# Patient Record
Sex: Male | Born: 1995
Health system: Southern US, Community
[De-identification: ages and names within clinical notes are randomized; demographics above are authoritative.]

## PROBLEM LIST (undated history)

## (undated) ENCOUNTER — Ambulatory Visit (HOSPITAL_COMMUNITY): Admission: EM | Payer: No Typology Code available for payment source | Source: Home / Self Care

## (undated) DIAGNOSIS — K5010 Crohn's disease of large intestine without complications: Secondary | ICD-10-CM

## (undated) DIAGNOSIS — K645 Perianal venous thrombosis: Secondary | ICD-10-CM

## (undated) DIAGNOSIS — F319 Bipolar disorder, unspecified: Secondary | ICD-10-CM

## (undated) HISTORY — DX: Perianal venous thrombosis: K64.5

## (undated) HISTORY — DX: Bipolar disorder, unspecified: F31.9

## (undated) HISTORY — PX: APPENDECTOMY: SHX54

---

## 2016-09-21 DIAGNOSIS — H5203 Hypermetropia, bilateral: Secondary | ICD-10-CM | POA: Insufficient documentation

## 2016-11-14 DIAGNOSIS — R591 Generalized enlarged lymph nodes: Secondary | ICD-10-CM | POA: Insufficient documentation

## 2017-07-31 DIAGNOSIS — F3181 Bipolar II disorder: Secondary | ICD-10-CM | POA: Insufficient documentation

## 2017-08-06 DIAGNOSIS — F1099 Alcohol use, unspecified with unspecified alcohol-induced disorder: Secondary | ICD-10-CM | POA: Insufficient documentation

## 2020-04-23 ENCOUNTER — Encounter (HOSPITAL_BASED_OUTPATIENT_CLINIC_OR_DEPARTMENT_OTHER): Payer: Self-pay | Admitting: Emergency Medicine

## 2020-04-23 ENCOUNTER — Other Ambulatory Visit: Payer: Self-pay

## 2020-04-23 ENCOUNTER — Emergency Department (HOSPITAL_BASED_OUTPATIENT_CLINIC_OR_DEPARTMENT_OTHER)
Admission: EM | Admit: 2020-04-23 | Discharge: 2020-04-23 | Disposition: A | Discharge status: Home / Self Care | Payer: Self-pay | Attending: Emergency Medicine | Admitting: Emergency Medicine

## 2020-04-23 DIAGNOSIS — K625 Hemorrhage of anus and rectum: Secondary | ICD-10-CM

## 2020-04-23 DIAGNOSIS — K649 Unspecified hemorrhoids: Secondary | ICD-10-CM

## 2020-04-23 DIAGNOSIS — K644 Residual hemorrhoidal skin tags: Secondary | ICD-10-CM | POA: Insufficient documentation

## 2020-04-23 HISTORY — DX: Bipolar disorder, unspecified: F31.9

## 2020-04-23 LAB — OCCULT BLOOD X 1 CARD TO LAB, STOOL: Fecal Occult Bld: NEGATIVE

## 2020-04-23 MED ORDER — HYDROCORTISONE (PERIANAL) 2.5 % EX CREA: 1.0000 "application " | TOPICAL_CREAM | Freq: Two times a day (BID) | CUTANEOUS | 0 refills | Status: DC

## 2020-04-23 NOTE — ED Triage Notes (Signed)
Reports rectal bleeding for a couple of weeks.  Thinks its a hemorrhoid.  Wanted to be seen at the [place] but they keep pushing his appointment back.

## 2020-04-23 NOTE — Discharge Instructions (Signed)
Use Anusol as directed.  You can also do sitz bath's to help with hemorrhoids.  I provided you with a referral to general surgery that you follow-up with regarding band ligation of her hemorrhoids.  I have also given a GI referral that you follow-up regarding her Crohn's disease.  Return the emergency department for any worsening pain, rectal bleeding, fevers or any other worsening concerning symptoms.

## 2020-04-23 NOTE — ED Provider Notes (Signed)
[facility] EMERGENCY DEPARTMENT Provider Note   CSN: [id number] Arrival date & time: 04/23/20  1741     History Chief Complaint  Patient presents with  . Rectal Bleeding    Kevin [name] is a 24 y.o. male past history of bipolar who presents for evaluation of hemorrhoid.  He reports that he started developing hemorrhoid 2 weeks ago.  He states that over the last 2 weeks, the area has become larger, more painful.  He states he has had some intermittent episodes of blood with bowel movements.  He states sometimes it is just a spotting.  He has not had any gross melena.  He states that he sees the [place] for his medical care and he has tried to see them to have this evaluated but they keep pushing back his appointment.  He states it is becoming more painful.  He states it hurts particular more when he tries to have a bowel movement feels like he has to strain.  He has a history of Crohn's and states that he does not take any medication for it.  He has not seen GI in about a year and a half.  He has not had any abdominal pain, fevers, nausea/vomiting.  He denies any recent anal sex or inserting any foreign body into the rectum.   The history is provided by the patient.       Past Medical History:  Diagnosis Date  . Bipolar 1 disorder (HCC)     There are no problems to display for this patient.   Past Surgical History:  Procedure Laterality Date  . APPENDECTOMY         No family history on file.  Social History   Tobacco Use  . Smoking status: Never Smoker  . Smokeless tobacco: Never Used  Substance Use Topics  . Alcohol use: Yes    Comment: off and on  . Drug use: Never    Home Medications Prior to Admission medications   Medication Sig Start Date End Date Taking? Authorizing Provider  hydrocortisone (ANUSOL-HC) 2.5 % rectal cream Place 1 application rectally 2 (two) times daily. 04/23/20   Maxwell Caul, PA-C    Allergies    Patient has no allergy  information on record.  Review of Systems   Review of Systems  Constitutional: Negative for fever.  Respiratory: Negative for shortness of breath.   Gastrointestinal: Positive for blood in stool. Negative for nausea and vomiting.  All other systems reviewed and are negative.   Physical Exam Updated Vital Signs BP 115/80 (BP Location: Right Arm)   Pulse 63   Temp 97.9 F (36.6 C) (Oral)   Resp 16   Ht 5\' 9"  (1.753 m)   Wt 58.5 kg   SpO2 100%   BMI 19.05 kg/m   Physical Exam Vitals and nursing note reviewed. Exam conducted with a chaperone present.  Constitutional:      Appearance: He is well-developed.  HENT:     Head: Normocephalic and atraumatic.  Eyes:     General: No scleral icterus.       Right eye: No discharge.        Left eye: No discharge.     Conjunctiva/sclera: Conjunctivae normal.  Pulmonary:     Effort: Pulmonary effort is normal.  Abdominal:     Comments: Abdomen is soft, non-distended, non-tender. No rigidity, No guarding. No peritoneal signs.  Genitourinary:    Comments: The exam was performed with a chaperone present , Jorja Loa).  Small nonthrombosed external hemorrhoid measuring about 1.5 cm noted to the 10:00 region.  No external bleeding.  No surrounding warmth, erythema.  He did have some pain with digital rectal exam.  No mass noted.  No occult blood noted on my exam. Skin:    General: Skin is warm and dry.  Neurological:     Mental Status: He is alert.  Psychiatric:        Speech: Speech normal.        Behavior: Behavior normal.     ED Results / Procedures / Treatments   Labs (all labs ordered are listed, but only abnormal results are displayed) Labs Reviewed  OCCULT BLOOD X 1 CARD TO LAB, STOOL    EKG None  Radiology No results found.  Procedures Procedures (including critical care time)  Medications Ordered in ED Medications - No data to display  ED Course  I have reviewed the triage vital signs and the nursing  notes.  Pertinent labs & imaging results that were available during my care of the patient were reviewed by me and considered in my medical decision making (see chart for details).    MDM Rules/Calculators/A&P                          24 year old male past medical history of Crohn's who presents for evaluation of rectal bleeding and hemorrhoids that have been ongoing for 2 weeks.  History of Crohn's.  Does not currently take any medicine or follow with GI.  Has not had any abdominal pain, fevers.  Denies any anal sex or rectal foreign body.  On initial arrival, he is afebrile nontoxic-appearing.  Vital signs are stable.  Rectal exam done with chaperone present shows a small 1.5 cm nonthrombosed external hemorrhoid at the 10:00 region.  No active bleeding.  Digital rectal exam showed no evidence of occult blood.  He did have some mild pain noted with the rectal exam at the hemorrhoid area.  No mass noted.  Fecal occult is negative.  Discussed results with patient.  I discussed with patient that at this time given the small size of the hemorrhoid and as well as its proximity to the anal sphincter, do not feel that it is amenable to I&D here in the ED.  His exam is not concerning for perirectal or perianal abscess.  I did discuss with him regarding the 2 weeks of rectal bleeding and given his history of Crohn's, we talked about potentially doing further work-up with lab work and imaging to evaluate for an acute Crohn's flare vs other cause of rectal bleeding.  At this time, do not suspect that he has acute GI but question if this is coming from internal hemorrhoids versus some other process.  We discussed at length regarding further work-up here in the emergency department after engaging in shared decision-making, patient opted to decline any further work-up here in the ED.  He states that he does not have any abdominal pain and he does not feel like this is a Crohn's flare.  He states that he has been  having normal bowel movements and states that he just wants the hemorrhoid taking care of.  We talked about referring him to outpatient general surgery for possible band ligation.  Additionally, he would like to have a GI referral that he can follow-up regarding his Crohn's care.  I discussed with patient that if anytime, symptoms get worse, he can return to the emergency department.  At this time, patient exhibits no emergent life-threatening condition that require further evaluation in ED. Patient had ample opportunity for questions and discussion. All patient's questions were answered with full understanding. Strict return precautions discussed. Patient expresses understanding and agreement to plan.   Portions of this note were generated with Scientist, clinical (histocompatibility and immunogenetics). Dictation errors may occur despite best attempts at proofreading.   Final Clinical Impression(s) / ED Diagnoses Final diagnoses:  Hemorrhoids, unspecified hemorrhoid type  Rectal bleeding    Rx / DC Orders ED Discharge Orders         Ordered    hydrocortisone (ANUSOL-HC) 2.5 % rectal cream  2 times daily        04/23/20 1905           Rosana Hoes 04/23/20 2004    Rolan Bucco, MD 04/23/20 2015

## 2020-05-03 ENCOUNTER — Telehealth: Payer: Self-pay

## 2020-05-03 NOTE — Telephone Encounter (Signed)
Spoke to patient who nees to be seen in hospital follow up for hemorrhoids. He states that he needs to wait on a referral from the [place] before scheduling an appointment. He said this can take up to a year. I have mailed him a letter,and he will call the office to schedule once he receives the referral.

## 2020-06-05 ENCOUNTER — Other Ambulatory Visit: Payer: Self-pay

## 2020-06-05 ENCOUNTER — Emergency Department (HOSPITAL_COMMUNITY)
Admission: EM | Admit: 2020-06-05 | Discharge: 2020-06-05 | Disposition: A | Discharge status: Home / Self Care | Payer: Self-pay | Attending: Emergency Medicine | Admitting: Emergency Medicine

## 2020-06-05 DIAGNOSIS — Z5321 Procedure and treatment not carried out due to patient leaving prior to being seen by health care provider: Secondary | ICD-10-CM | POA: Insufficient documentation

## 2020-06-05 DIAGNOSIS — T50901A Poisoning by unspecified drugs, medicaments and biological substances, accidental (unintentional), initial encounter: Secondary | ICD-10-CM | POA: Insufficient documentation

## 2020-06-05 NOTE — ED Notes (Addendum)
Pt decided to leave during triage d/t wait

## 2020-06-05 NOTE — ED Triage Notes (Signed)
Pt presents to ED POV. Pt c/o taking 600mg  Lamictal. Pt is not prescribed medication. Pt reports he was taking it to help sleep, pt denies SI. Pt called poison control and asked pt to come to ED.

## 2020-10-05 ENCOUNTER — Other Ambulatory Visit: Payer: Self-pay

## 2020-10-05 ENCOUNTER — Encounter (HOSPITAL_BASED_OUTPATIENT_CLINIC_OR_DEPARTMENT_OTHER): Payer: Self-pay

## 2020-10-05 ENCOUNTER — Emergency Department (HOSPITAL_BASED_OUTPATIENT_CLINIC_OR_DEPARTMENT_OTHER): Payer: Non-veteran care

## 2020-10-05 ENCOUNTER — Emergency Department (HOSPITAL_BASED_OUTPATIENT_CLINIC_OR_DEPARTMENT_OTHER): Payer: Non-veteran care | Admitting: Radiology

## 2020-10-05 ENCOUNTER — Emergency Department (HOSPITAL_BASED_OUTPATIENT_CLINIC_OR_DEPARTMENT_OTHER)
Admission: EM | Admit: 2020-10-05 | Discharge: 2020-10-05 | Disposition: A | Discharge status: Home / Self Care | Payer: Non-veteran care | Attending: Emergency Medicine | Admitting: Emergency Medicine

## 2020-10-05 DIAGNOSIS — R1011 Right upper quadrant pain: Secondary | ICD-10-CM | POA: Diagnosis not present

## 2020-10-05 DIAGNOSIS — R0782 Intercostal pain: Secondary | ICD-10-CM | POA: Insufficient documentation

## 2020-10-05 DIAGNOSIS — R110 Nausea: Secondary | ICD-10-CM | POA: Insufficient documentation

## 2020-10-05 DIAGNOSIS — B359 Dermatophytosis, unspecified: Secondary | ICD-10-CM | POA: Diagnosis not present

## 2020-10-05 LAB — COMPREHENSIVE METABOLIC PANEL
ALT: 10 U/L (ref 0–44)
AST: 15 U/L (ref 15–41)
Albumin: 4.8 g/dL (ref 3.5–5.0)
Alkaline Phosphatase: 34 U/L — ABNORMAL LOW (ref 38–126)
Anion gap: 8 (ref 5–15)
BUN: 23 mg/dL — ABNORMAL HIGH (ref 6–20)
CO2: 26 mmol/L (ref 22–32)
Calcium: 9.8 mg/dL (ref 8.9–10.3)
Chloride: 106 mmol/L (ref 98–111)
Creatinine, Ser: 0.97 mg/dL (ref 0.61–1.24)
GFR, Estimated: >60 mL/min (ref >60)
Glucose, Bld: 92 mg/dL (ref 70–99)
Potassium: 4.3 mmol/L (ref 3.5–5.1)
Sodium: 140 mmol/L (ref 135–145)
Total Bilirubin: 0.6 mg/dL (ref 0.3–1.2)
Total Protein: 7 g/dL (ref 6.5–8.1)

## 2020-10-05 LAB — CBC WITH DIFFERENTIAL/PLATELET
Abs Immature Granulocytes: 0 10*3/uL (ref 0.00–0.07)
Basophils Absolute: 0 10*3/uL (ref 0.0–0.1)
Basophils Relative: 1 %
Eosinophils Absolute: 0 10*3/uL (ref 0.0–0.5)
Eosinophils Relative: 1 %
HCT: 42.6 % (ref 39.0–52.0)
Hemoglobin: 14.1 g/dL (ref 13.0–17.0)
Immature Granulocytes: 0 %
Lymphocytes Relative: 33 %
Lymphs Abs: 1.2 10*3/uL (ref 0.7–4.0)
MCH: 28.3 pg (ref 26.0–34.0)
MCHC: 33.1 g/dL (ref 30.0–36.0)
MCV: 85.4 fL (ref 80.0–100.0)
Monocytes Absolute: 0.3 10*3/uL (ref 0.1–1.0)
Monocytes Relative: 7 %
Neutro Abs: 2.1 10*3/uL (ref 1.7–7.7)
Neutrophils Relative %: 58 %
Platelets: 227 10*3/uL (ref 150–400)
RBC: 4.99 MIL/uL (ref 4.22–5.81)
RDW: 12 % (ref 11.5–15.5)
WBC: 3.6 10*3/uL — ABNORMAL LOW (ref 4.0–10.5)
nRBC: 0 % (ref 0.0–0.2)

## 2020-10-05 LAB — LIPASE, BLOOD: Lipase: 43 U/L (ref 11–51)

## 2020-10-05 MED ORDER — KETOROLAC TROMETHAMINE 30 MG/ML IJ SOLN
30.0000 mg | Freq: Once | INTRAMUSCULAR | Status: AC
  Administered 2020-10-05: 30 mg via INTRAVENOUS
  Filled 2020-10-05: qty 1

## 2020-10-05 MED ORDER — KETOCONAZOLE 2 % EX CREA
1.0000 | TOPICAL_CREAM | Freq: Every day | CUTANEOUS | 0 refills | Status: DC
Start: 2020-10-05 — End: 2021-02-09

## 2020-10-05 NOTE — ED Provider Notes (Addendum)
[facility] EMERGENCY DEPT Provider Note   CSN: [id number] Arrival date & time: 10/05/20  2012     History Chief Complaint  Patient presents with  . Abdominal Pain    Kevin [name] is a 25 y.o. male.  HPI   25 year old male presents to the emergency department with upper abdominal pain.  Patient states that he has been having this intermittently for the past 2 months however tonight it woke him up from sleep and was sharp associated with nausea.  Patient denies any fever.  No history of issues with his gallbladder or pancreas before.  No history of GERD.  He has had a slight decrease in appetite but denies any vomiting, diarrhea.  No genitourinary symptoms.  Patient also has concern for a rash on his right shoulder.  Past Medical History:  Diagnosis Date  . Bipolar 1 disorder (HCC)     There are no problems to display for this patient.   Past Surgical History:  Procedure Laterality Date  . APPENDECTOMY         No family history on file.  Social History   Tobacco Use  . Smoking status: Never Smoker  . Smokeless tobacco: Never Used  Substance Use Topics  . Alcohol use: Not Currently  . Drug use: Never    Home Medications Prior to Admission medications   Medication Sig Start Date End Date Taking? Authorizing Provider  hydrocortisone (ANUSOL-HC) 2.5 % rectal cream Place 1 application rectally 2 (two) times daily. 04/23/20   Maxwell Caul, PA-C    Allergies    Patient has no allergy information on record.  Review of Systems   Review of Systems  Constitutional: Positive for appetite change. Negative for chills and fever.  HENT: Negative for congestion.   Eyes: Negative for visual disturbance.  Respiratory: Negative for shortness of breath.   Cardiovascular: Negative for chest pain.  Gastrointestinal: Positive for abdominal pain and nausea. Negative for diarrhea and vomiting.  Genitourinary: Negative for dysuria.  Skin: Negative for rash.   Neurological: Negative for headaches.    Physical Exam Updated Vital Signs BP 113/71 (BP Location: Left Arm)   Pulse (!) 55   Temp 98.5 F (36.9 C) (Oral)   Resp 18   Ht 5\' 9"  (1.753 m)   Wt 59.9 kg   SpO2 100%   BMI 19.49 kg/m   Physical Exam Vitals and nursing note reviewed.  Constitutional:      Appearance: Normal appearance.  HENT:     Head: Normocephalic.     Mouth/Throat:     Mouth: Mucous membranes are moist.  Cardiovascular:     Rate and Rhythm: Normal rate.  Pulmonary:     Effort: Pulmonary effort is normal. No respiratory distress.  Abdominal:     Palpations: Abdomen is soft.     Tenderness: There is abdominal tenderness in the right upper quadrant, epigastric area and left upper quadrant. There is no guarding or rebound.  Skin:    General: Skin is warm.     Comments: Discolored rash on the right shoulder, circular with central clearing, suspicious for tinea  Neurological:     Mental Status: He is alert and oriented to person, place, and time. Mental status is at baseline.  Psychiatric:        Mood and Affect: Mood normal.     ED Results / Procedures / Treatments   Labs (all labs ordered are listed, but only abnormal results are displayed) Labs Reviewed  CBC WITH  DIFFERENTIAL/PLATELET - Abnormal; Notable for the following components:      Result Value   WBC 3.6 (*)    All other components within normal limits  LIPASE, BLOOD  COMPREHENSIVE METABOLIC PANEL    EKG None  Radiology DG Ribs Unilateral W/Chest Left  Result Date: 10/05/2020 CLINICAL DATA:  Recent fall with left rib pain, initial encounter EXAM: LEFT RIBS AND CHEST - 3+ VIEW COMPARISON:  None. FINDINGS: No fracture or other bone lesions are seen involving the ribs. There is no evidence of pneumothorax or pleural effusion. Both lungs are clear. Heart size and mediastinal contours are within normal limits. IMPRESSION: No acute abnormality noted. Electronically Signed   By: Alcide Clever M.D.    On: 10/05/2020 22:37    Procedures Procedures   Medications Ordered in ED Medications  ketorolac (TORADOL) 30 MG/ML injection 30 mg (30 mg Intravenous Given 10/05/20 2216)    ED Course  I have reviewed the triage vital signs and the nursing notes.  Pertinent labs & imaging results that were available during my care of the patient were reviewed by me and considered in my medical decision making (see chart for details).    MDM Rules/Calculators/A&P                          25 year old male presents the emergency department with upper abdominal pain, nausea and a rash on his right shoulder.  Vitals are stable on arrival, he is afebrile.  He has inconsistent tenderness to palpation of the upper abdomen but is otherwise a benign exam.  He does have what appears to be tinea on the right shoulder.  Will evaluate blood work.  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  We will do ultrasound to rule out gallbladder pathology and treat symptomatically.   Ultrasound is unremarkable, patient states that his symptoms are improved.  Most likely musculoskeletal.  Patient will be discharged and treated as an outpatient.  Discharge plan and strict return to ED precautions discussed, patient verbalizes understanding and agreement.  Final Clinical Impression(s) / ED Diagnoses Final diagnoses:  RUQ abdominal pain    Rx / DC Orders ED Discharge Orders    None       Rozelle Logan, DO 10/05/20 2309    Rozelle Logan, DO 10/05/20 2348

## 2020-10-05 NOTE — Discharge Instructions (Addendum)
You have been seen and discharged from the emergency department.  Your blood work, chest x-ray and ultrasound were negative.  Your most likely suffering from musculoskeletal pain.  You may treated as such.  Use cream for rash as directed.  Follow-up with your primary provider for reevaluation and further care. Take home medications as prescribed. If you have any worsening symptoms or further concerns for your health please return to an emergency department for further evaluation.

## 2020-10-05 NOTE — ED Triage Notes (Signed)
Patient here POV from Home with ABD Pain.  Pain is Left Upper Quandrant and radiates to R side when palpated.   Pain began 2 months ago and has been worsening. Pain came today because it awoke him from sleep last night.   Nausea. No Vomiting/Diarrhea. No SOB or CP.

## 2020-12-28 ENCOUNTER — Encounter: Payer: Self-pay | Admitting: Internal Medicine

## 2020-12-28 ENCOUNTER — Encounter (HOSPITAL_BASED_OUTPATIENT_CLINIC_OR_DEPARTMENT_OTHER): Payer: Self-pay | Admitting: Emergency Medicine

## 2020-12-28 ENCOUNTER — Emergency Department (HOSPITAL_BASED_OUTPATIENT_CLINIC_OR_DEPARTMENT_OTHER)
Admission: EM | Admit: 2020-12-28 | Discharge: 2020-12-28 | Disposition: A | Discharge status: Home / Self Care | Payer: Self-pay | Attending: Emergency Medicine | Admitting: Emergency Medicine

## 2020-12-28 ENCOUNTER — Other Ambulatory Visit: Payer: Self-pay

## 2020-12-28 DIAGNOSIS — R197 Diarrhea, unspecified: Secondary | ICD-10-CM | POA: Insufficient documentation

## 2020-12-28 DIAGNOSIS — K648 Other hemorrhoids: Secondary | ICD-10-CM | POA: Insufficient documentation

## 2020-12-28 DIAGNOSIS — K644 Residual hemorrhoidal skin tags: Secondary | ICD-10-CM

## 2020-12-28 MED ORDER — HYDROCORTISONE (PERIANAL) 2.5 % EX CREA: 1.0000 "application " | TOPICAL_CREAM | Freq: Two times a day (BID) | CUTANEOUS | 0 refills | Status: DC

## 2020-12-28 NOTE — ED Provider Notes (Signed)
[facility] EMERGENCY DEPT Provider Note   CSN: [id number] Arrival date & time: 12/28/20  3614     History Chief Complaint  Patient presents with   Hemorrhoids    Kevin [name] is a 25 y.o. male.  He is here with a complaint of a hemorrhoid that started a few days ago.  He attributes it to lifting heavy things at work.  Complaining of moderate throbbing pain worse with ambulation and sitting.  He also has a history of Crohn's.  Not currently on any immunosuppressants.  He said he had bronchitis last week and had some antibiotics for that and that caused some diarrhea and he thinks that he might of also aggravated the hemorrhoid.  No rectal bleeding or fever.  Has been using some Preparation H and topical lidocaine.  He usually follows through the [place] but is looking for a civilian GI referral to help him manage his Crohn's disease.  The history is provided by the patient.      Past Medical History:  Diagnosis Date   Bipolar 1 disorder (HCC)     There are no problems to display for this patient.   Past Surgical History:  Procedure Laterality Date   APPENDECTOMY         No family history on file.  Social History   Tobacco Use   Smoking status: Never   Smokeless tobacco: Never  Substance Use Topics   Alcohol use: Not Currently   Drug use: Never    Home Medications Prior to Admission medications   Medication Sig Start Date End Date Taking? Authorizing Provider  hydrocortisone (ANUSOL-HC) 2.5 % rectal cream Place 1 application rectally 2 (two) times daily. 04/23/20   Maxwell Caul, PA-C  ketoconazole (NIZORAL) 2 % cream Apply 1 application topically daily. 10/05/20   Horton, Clabe Seal, DO    Allergies    Patient has no allergy information on record.  Review of Systems   Review of Systems  Constitutional:  Negative for fever.  Gastrointestinal:  Positive for diarrhea and rectal pain. Negative for abdominal pain, anal bleeding, blood in stool and  vomiting.  Genitourinary:  Negative for dysuria.  Musculoskeletal:  Negative for back pain.  Skin:  Negative for rash.   Physical Exam Updated Vital Signs BP (!) 127/92   Pulse (!) 55   Temp 97.6 F (36.4 C)   Resp 18   Ht 5\' 9"  (1.753 m)   Wt 59 kg   SpO2 100%   BMI 19.20 kg/m   Physical Exam Vitals and nursing note reviewed.  Constitutional:      Appearance: Normal appearance. He is well-developed.  HENT:     Head: Normocephalic and atraumatic.  Eyes:     Conjunctiva/sclera: Conjunctivae normal.  Pulmonary:     Effort: Pulmonary effort is normal.  Abdominal:     Tenderness: There is no abdominal tenderness. There is no guarding.  Genitourinary:    Comments: He has approximately a 1 cm tender hemorrhoid.  Does not appear thrombosed.  Do not see any fistula tracts. Musculoskeletal:     Cervical back: Neck supple.  Skin:    General: Skin is warm and dry.  Neurological:     Mental Status: He is alert.     GCS: GCS eye subscore is 4. GCS verbal subscore is 5. GCS motor subscore is 6.    ED Results / Procedures / Treatments   Labs (all labs ordered are listed, but only abnormal results are displayed) Labs  Reviewed - No data to display  EKG None  Radiology No results found.  Procedures Procedures   Medications Ordered in ED Medications - No data to display  ED Course  I have reviewed the triage vital signs and the nursing notes.  Pertinent labs & imaging results that were available during my care of the patient were reviewed by me and considered in my medical decision making (see chart for details).    MDM Rules/Calculators/A&P                         25 year old male here with tender hemorrhoid after after antibiotics for bronchitis.  He is clinically nontoxic-appearing.  Hemorrhoid minimally enlarged and does not appear to be thrombosed and would benefit from incision.  We will provide prescription for some Anusol HC.  Also given referral to GI for the  management of his Crohn's disease.  Return instructions discussed  Final Clinical Impression(s) / ED Diagnoses Final diagnoses:  External hemorrhoid    Rx / DC Orders ED Discharge Orders          Ordered    hydrocortisone (ANUSOL-HC) 2.5 % rectal cream  2 times daily        12/28/20 0715    Ambulatory referral to Gastroenterology        12/28/20 0715             Terrilee Files, MD 12/28/20 1811

## 2020-12-28 NOTE — ED Notes (Signed)
Patient verbalizes understanding of discharge instructions. Opportunity for questioning and answers were provided. Patient discharged from ED.  °

## 2020-12-28 NOTE — Discharge Instructions (Signed)
You were seen in the emergency department for tender external hemorrhoid.  We are prescribing you some steroid cream to apply to the area twice a day.  You can use Tylenol and ibuprofen for pain.  We also placed a referral for you for a gastroenterologist.

## 2020-12-28 NOTE — ED Triage Notes (Signed)
Pt has a hemorrhoid, states he wants a referral for a GI physician for this and his chron's

## 2021-01-04 ENCOUNTER — Encounter: Payer: Self-pay | Admitting: *Deleted

## 2021-01-24 ENCOUNTER — Ambulatory Visit: Payer: Self-pay | Admitting: Internal Medicine

## 2021-02-04 ENCOUNTER — Encounter (HOSPITAL_BASED_OUTPATIENT_CLINIC_OR_DEPARTMENT_OTHER): Payer: Self-pay

## 2021-02-04 ENCOUNTER — Observation Stay (HOSPITAL_BASED_OUTPATIENT_CLINIC_OR_DEPARTMENT_OTHER)
Admission: EM | Admit: 2021-02-04 | Discharge: 2021-02-05 | Disposition: A | Discharge status: Home / Self Care | Payer: No Typology Code available for payment source | Attending: Cardiovascular Disease | Admitting: Cardiovascular Disease

## 2021-02-04 ENCOUNTER — Observation Stay (HOSPITAL_COMMUNITY): Payer: No Typology Code available for payment source

## 2021-02-04 ENCOUNTER — Other Ambulatory Visit: Payer: Self-pay

## 2021-02-04 DIAGNOSIS — R0789 Other chest pain: Secondary | ICD-10-CM | POA: Diagnosis present

## 2021-02-04 DIAGNOSIS — Z20822 Contact with and (suspected) exposure to covid-19: Secondary | ICD-10-CM | POA: Insufficient documentation

## 2021-02-04 DIAGNOSIS — R079 Chest pain, unspecified: Secondary | ICD-10-CM | POA: Diagnosis present

## 2021-02-04 DIAGNOSIS — I200 Unstable angina: Secondary | ICD-10-CM | POA: Diagnosis not present

## 2021-02-04 DIAGNOSIS — R059 Cough, unspecified: Secondary | ICD-10-CM

## 2021-02-04 LAB — CBC WITH DIFFERENTIAL/PLATELET
Abs Immature Granulocytes: 0.02 K/uL (ref 0.00–0.07)
Basophils Absolute: 0 K/uL (ref 0.0–0.1)
Basophils Relative: 0 %
Eosinophils Absolute: 0.1 K/uL (ref 0.0–0.5)
Eosinophils Relative: 1 %
HCT: 43.1 % (ref 39.0–52.0)
Hemoglobin: 14.5 g/dL (ref 13.0–17.0)
Immature Granulocytes: 0 %
Lymphocytes Relative: 13 %
Lymphs Abs: 1 K/uL (ref 0.7–4.0)
MCH: 28.3 pg (ref 26.0–34.0)
MCHC: 33.6 g/dL (ref 30.0–36.0)
MCV: 84 fL (ref 80.0–100.0)
Monocytes Absolute: 0.3 K/uL (ref 0.1–1.0)
Monocytes Relative: 4 %
Neutro Abs: 6 K/uL (ref 1.7–7.7)
Neutrophils Relative %: 82 %
Platelets: 237 K/uL (ref 150–400)
RBC: 5.13 MIL/uL (ref 4.22–5.81)
RDW: 12.2 % (ref 11.5–15.5)
WBC: 7.4 K/uL (ref 4.0–10.5)
nRBC: 0 % (ref 0.0–0.2)

## 2021-02-04 LAB — CBC
HCT: 42.2 % (ref 39.0–52.0)
Hemoglobin: 14.4 g/dL (ref 13.0–17.0)
MCH: 28.9 pg (ref 26.0–34.0)
MCHC: 34.1 g/dL (ref 30.0–36.0)
MCV: 84.6 fL (ref 80.0–100.0)
Platelets: 236 10*3/uL (ref 150–400)
RBC: 4.99 MIL/uL (ref 4.22–5.81)
RDW: 12.2 % (ref 11.5–15.5)
WBC: 3.3 10*3/uL — ABNORMAL LOW (ref 4.0–10.5)
nRBC: 0 % (ref 0.0–0.2)

## 2021-02-04 LAB — BASIC METABOLIC PANEL WITH GFR
Anion gap: 11 (ref 5–15)
BUN: 30 mg/dL — ABNORMAL HIGH (ref 6–20)
CO2: 24 mmol/L (ref 22–32)
Calcium: 9.5 mg/dL (ref 8.9–10.3)
Chloride: 104 mmol/L (ref 98–111)
Creatinine, Ser: 0.96 mg/dL (ref 0.61–1.24)
GFR, Estimated: >60 mL/min
Glucose, Bld: 95 mg/dL (ref 70–99)
Potassium: 4.2 mmol/L (ref 3.5–5.1)
Sodium: 139 mmol/L (ref 135–145)

## 2021-02-04 LAB — CREATININE, SERUM
Creatinine, Ser: 1.07 mg/dL (ref 0.61–1.24)
GFR, Estimated: >60 mL/min (ref >60)

## 2021-02-04 LAB — TROPONIN I (HIGH SENSITIVITY)
Troponin I (High Sensitivity): 55 ng/L — ABNORMAL HIGH
Troponin I (High Sensitivity): 79 ng/L — ABNORMAL HIGH
Troponin I (High Sensitivity): 40 ng/L — ABNORMAL HIGH (ref <18)

## 2021-02-04 LAB — RESP PANEL BY RT-PCR (FLU A&B, COVID) ARPGX2
Influenza A by PCR: NEGATIVE
Influenza B by PCR: NEGATIVE
SARS Coronavirus 2 by RT PCR: NEGATIVE

## 2021-02-04 LAB — HIV ANTIBODY (ROUTINE TESTING W REFLEX): HIV Screen 4th Generation wRfx: NONREACTIVE

## 2021-02-04 MED ORDER — LAMOTRIGINE 25 MG PO TABS
75.0000 mg | ORAL_TABLET | Freq: Every day | ORAL | Status: DC
  Administered 2021-02-04: 75 mg via ORAL
  Filled 2021-02-04: qty 3

## 2021-02-04 MED ORDER — VITAMIN D3 25 MCG (1000 UNIT) PO TABS
1000.0000 [IU] | ORAL_TABLET | Freq: Every day | ORAL | Status: DC
  Administered 2021-02-05: 1000 [IU] via ORAL
  Filled 2021-02-04 (×2): qty 1

## 2021-02-04 MED ORDER — METOPROLOL TARTRATE 25 MG PO TABS: 25.0000 mg | ORAL_TABLET | Freq: Once | ORAL | Status: DC | PRN

## 2021-02-04 MED ORDER — HEPARIN SODIUM (PORCINE) 5000 UNIT/ML IJ SOLN
5000.0000 [IU] | Freq: Three times a day (TID) | INTRAMUSCULAR | Status: DC
  Administered 2021-02-04 – 2021-02-05 (×3): 5000 [IU] via SUBCUTANEOUS
  Filled 2021-02-04 (×3): qty 1

## 2021-02-04 MED ORDER — ASPIRIN EC 81 MG PO TBEC: 81.0000 mg | DELAYED_RELEASE_TABLET | Freq: Every day | ORAL | Status: DC

## 2021-02-04 MED ORDER — SODIUM CHLORIDE 0.9% FLUSH
3.0000 mL | Freq: Two times a day (BID) | INTRAVENOUS | Status: DC
  Administered 2021-02-04: 3 mL via INTRAVENOUS
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
## 2021-02-04 NOTE — ED Notes (Signed)
Pt ambulated to bathroom, in hallway. Meal and Sprite given.
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
## 2021-02-05 ENCOUNTER — Observation Stay (HOSPITAL_COMMUNITY): Payer: No Typology Code available for payment source

## 2021-02-05 ENCOUNTER — Observation Stay (HOSPITAL_BASED_OUTPATIENT_CLINIC_OR_DEPARTMENT_OTHER): Payer: No Typology Code available for payment source

## 2021-02-05 DIAGNOSIS — R079 Chest pain, unspecified: Secondary | ICD-10-CM

## 2021-02-05 DIAGNOSIS — I200 Unstable angina: Secondary | ICD-10-CM | POA: Diagnosis not present

## 2021-02-05 LAB — ECHOCARDIOGRAM COMPLETE
AR max vel: 3.1 cm2
AV Area VTI: 3.2 cm2
AV Area mean vel: 2.93 cm2
AV Mean grad: 2 mmHg
AV Peak grad: 2.8 mmHg
Ao pk vel: 0.84 m/s
Area-P 1/2: 2.33 cm2
Height: 69 in
MV VTI: 2.3 cm2
S' Lateral: 2.9 cm
Weight: 2198.4 oz

## 2021-02-05 LAB — BASIC METABOLIC PANEL
Anion gap: 7 (ref 5–15)
BUN: 24 mg/dL — ABNORMAL HIGH (ref 6–20)
CO2: 27 mmol/L (ref 22–32)
Calcium: 9.4 mg/dL (ref 8.9–10.3)
Chloride: 101 mmol/L (ref 98–111)
Creatinine, Ser: 1.12 mg/dL (ref 0.61–1.24)
GFR, Estimated: >60 mL/min (ref >60)
Glucose, Bld: 93 mg/dL (ref 70–99)
Potassium: 3.8 mmol/L (ref 3.5–5.1)
Sodium: 135 mmol/L (ref 135–145)

## 2021-02-05 LAB — CBC
HCT: 43.5 % (ref 39.0–52.0)
Hemoglobin: 14.6 g/dL (ref 13.0–17.0)
MCH: 28.5 pg (ref 26.0–34.0)
MCHC: 33.6 g/dL (ref 30.0–36.0)
MCV: 84.8 fL (ref 80.0–100.0)
Platelets: 233 10*3/uL (ref 150–400)
RBC: 5.13 MIL/uL (ref 4.22–5.81)
RDW: 12.3 % (ref 11.5–15.5)
WBC: 4.8 10*3/uL (ref 4.0–10.5)
nRBC: 0 % (ref 0.0–0.2)

## 2021-02-05 MED ORDER — NITROGLYCERIN 0.4 MG SL SUBL
0.8000 mg | SUBLINGUAL_TABLET | SUBLINGUAL | Status: DC | PRN
  Administered 2021-02-05: 0.8 mg via SUBLINGUAL
  Filled 2021-02-05: qty 2

## 2021-02-05 MED ORDER — METOPROLOL TARTRATE 5 MG/5ML IV SOLN
5.0000 mg | INTRAVENOUS | Status: DC | PRN
  Filled 2021-02-05: qty 5

## 2021-02-05 MED ORDER — METOPROLOL TARTRATE 25 MG PO TABS
25.0000 mg | ORAL_TABLET | Freq: Once | ORAL | Status: AC
  Administered 2021-02-05: 25 mg via ORAL
  Filled 2021-02-05: qty 1

## 2021-02-05 MED ORDER — IOHEXOL 350 MG/ML SOLN
100.0000 mL | Freq: Once | INTRAVENOUS | Status: AC | PRN
  Administered 2021-02-05: 100 mL via INTRAVENOUS

## 2021-02-05 NOTE — Discharge Summary (Addendum)
Discharge Summary    Patient ID: Dao Memmott MRN: [id number]; DOB: [birth year]  Admit date: 02/04/2021 Discharge date: 02/05/2021  PCP:  Clinic, [facility]   [facility] Providers Cardiologist:  None        Discharge Diagnoses    Active Problems:   Unstable angina [facility])   Chest pain  Diagnostic Studies/Procedures    CCTA 02/05/21:  IMPRESSION: 1. Coronary calcium score of 0.   2. Normal coronary origin with right dominance.   3. There are step artifacts due to motion artifact from irregular heart rhythm and respiratory motion, however no evidence of CAD seen   CAD-RADS 0. No evidence of CAD (0%). Consider non-atherosclerotic causes of chest pain. _____________  Echocardiogram 02/05/21: Pending   History of Present Illness     Shadow Schedler is a 25 y.o. male with a PMH of bipolar disorder, PTSD, Crohn's disease, who is admitted under cardiology for chest pain, elevated trop, abnormal EKG change.   He presented to the [facility] ED 02/04/21 after a having an intense argument with his roommate triggering bag memory and PTSD, with subsequent development of chest pain, SOB, cough, anxiety, heart palpitation today. He felt he had a panic attack. He states his chest pain was severe and located mid-sternum area, 10/10 when it was at worst level. He had an intense coughing spells, states he had a cough lingering since he had bronchitis.  He was feeling short of breath and unwell, did not think he could drive to the ER.  He has had 1 similar episodes in the past when he had bd anxiety.  He was chest pain free upon ED arrival, but now is having mild midsternal chest pain 2/10.  He denied any known personal or family history of cardiac disease.  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 He states his weight fluctuates 130-140 ibs in 1 day and it seems strange.  He was diagnosed with Crohn's disease at [facility] via colonoscopy, is not  requiring any medication and has no symptoms at the moment.  He denied any tobacco use, alcohol use, illicit drug use.    Admission diagnostic showed elevated BUN 30, otherwise unremarkable BMP.  CBC differential WNL.  High sensitive troponin 55>79.  Flu and COVID-negative.  EKG showed sinus rhythm with ventricular rate of 69 bpm, minimal ST depression of lead III, repolarization abnormality noted at V3-4, no prior EKG available to compare. He was transferred from [facility] ER to [facility].  Cardiology is asked to evaluate and admit the patient.   Hospital Course     Chest pain/Elevated trop -Presented with chest pain /SOB / heart palpitation after intense argument with roommate triggering PTSD -History is consistent with panic attack -High sensitive troponin 55 >79 >40 , not consistent with ACS -EKG showed sinus rhythm with ventricular rate of 69 bpm, minimal ST depression of lead III, repolarization abnormality V2 -V4, no prior EKG available to compare. -No significant risk factor for CAD identified  -He underwent CCTA for more definitive evaluation 02/05/21 which showed a calcium score of 0 with normal coronary origin with right dominance.  -Echocardiogram pending however do not anticipate any abnormalities. Pt has been scheduled for follow up with our team to discuss if not formally ready by discharge    PTSD/Bipolar disorder -Resumed home medication Lamictal -Follow up with outpatient PCP/PSY at [facility]   Crohn's disease -Not currently in exacerbation   DVT ppx -Subcutaneous heparin  Consultants: None  The patient was seen and examined by Dr. Elease Hashimoto who feels that he is stable and ready for discharge today, 02/05/21.   Did the patient have an acute coronary syndrome (MI, NSTEMI, STEMI, etc) this admission?:  No                               Did the patient have a percutaneous coronary intervention (stent / angioplasty)?:  No.   _____________  Discharge Vitals Blood pressure 100/71, pulse (!)  59, temperature 97.8 F (36.6 C), temperature source Oral, resp. rate 19, height 5\' 9"  (1.753 m), weight 62.3 kg, SpO2 99 %.  Filed Weights   02/04/21 0406 02/04/21 1600  Weight: 59 kg 62.3 kg    Labs & Radiologic Studies    CBC Recent Labs    02/04/21 0427 02/04/21 1633 02/05/21 0313  WBC 7.4 3.3* 4.8  NEUTROABS 6.0  --   --   HGB 14.5 14.4 14.6  HCT 43.1 42.2 43.5  MCV 84.0 84.6 84.8  PLT 237 236 233   Basic Metabolic Panel Recent Labs    02/07/21 0427 02/04/21 1633 02/05/21 0313  NA 139  --  135  K 4.2  --  3.8  CL 104  --  101  CO2 24  --  27  GLUCOSE 95  --  93  BUN 30*  --  24*  CREATININE 0.96 1.07 1.12  CALCIUM 9.5  --  9.4   Liver Function Tests No results for input(s): AST, ALT, ALKPHOS, BILITOT, PROT, ALBUMIN in the last 72 hours. No results for input(s): LIPASE, AMYLASE in the last 72 hours. High Sensitivity Troponin:   Recent Labs  Lab 02/04/21 0427 02/04/21 0627 02/04/21 1500  TROPONINIHS 55* 79* 40*    BNP Invalid input(s): POCBNP D-Dimer No results for input(s): DDIMER in the last 72 hours. Hemoglobin A1C No results for input(s): HGBA1C in the last 72 hours. Fasting Lipid Panel No results for input(s): CHOL, HDL, LDLCALC, TRIG, CHOLHDL, LDLDIRECT in the last 72 hours. Thyroid Function Tests No results for input(s): TSH, T4TOTAL, T3FREE, THYROIDAB in the last 72 hours.  Invalid input(s): FREET3 _____________  DG Chest 1 View  Result Date: 02/04/2021 CLINICAL DATA:  Chest pain and cough. EXAM: CHEST  1 VIEW COMPARISON:  10/05/2020 FINDINGS: The heart size and mediastinal contours are within normal limits. Both lungs are clear. The visualized skeletal structures are unremarkable. IMPRESSION: No active disease. Electronically Signed   By: 10/07/2020 M.D.   On: 02/04/2021 16:48   CT CORONARY MORPH W/CTA COR W/SCORE W/CA W/CM &/OR WO/CM  Addendum Date: 02/05/2021   ADDENDUM REPORT: 02/05/2021 10:34 CLINICAL DATA:  54M with chest pain,  troponin elevation EXAM: Cardiac/Coronary CTA TECHNIQUE: The patient was scanned on a 02/07/2021. FINDINGS: A 100 kV prospective scan was triggered in the descending thoracic aorta at 111 HU's. Axial non-contrast 3 mm slices were carried out through the heart. The data set was analyzed on a dedicated work station and scored using the Agatson method. Gantry rotation speed was 250 msecs and collimation was .6 mm. No beta blockade and 0.8 mg of sl NTG was given. The 3D data set was reconstructed in 5% intervals of the 35-75% of the R-R cycle. Diastolic and systolic phases were analyzed on a dedicated work station using MPR, MIP and VRT modes. The patient received 80 cc of contrast. Coronary Arteries:  Normal coronary origin.  Right dominance.  RCA is a large dominant artery that gives rise to PDA and PLA. There is no plaque. Left main is a large artery that gives rise to LAD and LCX arteries. LAD is a large vessel that has no plaque. LCX is a non-dominant artery that gives rise to one large OM1 branch. There is no plaque. Other findings: Left Ventricle: Normal size Left Atrium: Normal size Pulmonary Veins: Normal configuration Right Ventricle: Normal size Right Atrium: Normal size Cardiac valves: No calcifications Thoracic aorta: Normal size Pulmonary Arteries: Normal size Systemic Veins: Normal drainage Pericardium: Normal thickness IMPRESSION: 1. Coronary calcium score of 0. 2. Normal coronary origin with right dominance. 3. There are step artifacts due to motion artifact from irregular heart rhythm and respiratory motion, however no evidence of CAD seen CAD-RADS 0. No evidence of CAD (0%). Consider non-atherosclerotic causes of chest pain. Electronically Signed   By: Epifanio Lescheshristopher  Schumann M.D.   On: 02/05/2021 10:34   Result Date: 02/05/2021 EXAM: OVER-READ INTERPRETATION  CT CHEST The following report is an over-read performed by radiologist Dr. Jeronimo GreavesKyle Talbot of [facility] on 02/05/2021.  This over-read does not include interpretation of cardiac or coronary anatomy or pathology. The coronary CTA interpretation by the cardiologist is attached. COMPARISON:  Chest radiograph 1 day prior FINDINGS: Vascular: Normal aortic caliber. No central pulmonary embolism, on this non-dedicated study. Mediastinum/Nodes: No imaged thoracic adenopathy. Lungs/Pleura: No pleural fluid.  Clear imaged lungs. Upper Abdomen: Normal imaged portions of the liver, spleen, stomach. Musculoskeletal: Mild bilateral gynecomastia. No acute osseous abnormality. IMPRESSION: 1.  No acute findings in the imaged extracardiac chest. 2. Mild bilateral gynecomastia Electronically Signed: By: Jeronimo GreavesKyle  Talbot M.D. On: 02/05/2021 09:24   Disposition   Pt is being discharged home today in good condition.  Follow-up Plans & Appointments    Follow-up Information     Dyann KiefLenze, Michele M, PA-C Follow up on 03/15/2021.   Specialty: Cardiology Why: at 1:15pm Contact information: [address] [place] [address] [phone]                Discharge Instructions     Call MD for:  difficulty breathing, headache or visual disturbances   Complete by: As directed    Call MD for:  extreme fatigue   Complete by: As directed    Call MD for:  hives   Complete by: As directed    Call MD for:  persistant dizziness or light-headedness   Complete by: As directed    Call MD for:  persistant nausea and vomiting   Complete by: As directed    Call MD for:  redness, tenderness, or signs of infection (pain, swelling, redness, odor or green/yellow discharge around incision site)   Complete by: As directed    Call MD for:  severe uncontrolled pain   Complete by: As directed    Call MD for:  temperature >100.4   Complete by: As directed    Diet - low sodium heart healthy   Complete by: As directed    Increase activity slowly   Complete by: As directed       Discharge Medications   Allergies as of 02/05/2021    No Known Allergies      Medication List     TAKE these medications    cholecalciferol 25 MCG (1000 UNIT) tablet Commonly known as: VITAMIN D Take 1,000 Units by mouth daily with breakfast.   hydrocortisone 2.5 % rectal cream Commonly known as: ANUSOL-HC Place 1  application rectally 2 (two) times daily.   ketoconazole 2 % cream Commonly known as: NIZORAL Apply 1 application topically daily.   LaMICtal 25 MG tablet Generic drug: lamoTRIgine Take 75 mg by mouth at bedtime.        Outstanding Labs/Studies   None   Duration of Discharge Encounter   Greater than 30 minutes including physician time.  Signed, Georgie Chard, NP 02/05/2021, 11:17 AM  Attending Note:   The patient was seen and examined.  Agree with assessment and plan as noted above.  Changes made to the above note as needed.  Patient seen and independently examined with Georgie Chard, NP.   We discussed all aspects of the encounter. I agree with the assessment and plan as stated above.     Unstable angina :   + troponins.   Echo - the images of the apex were not seen very well.  In 1 image, Im concerned about mild hypokinesis of the inferior septal apex.   In other views, the apex appears to contract well.   This could have been a mild case of Takotsubo syndrome.    He is bradycardic at baseline - I do not think he will tolerate coreg.    OK for Dc today . I'll see him in follow up in a month ( or my APP)   I have spent a total of 40 minutes with patient reviewing hospital  notes , telemetry, EKGs, labs and examining patient as well as establishing an assessment and plan that was discussed with the patient.  > 50% of time was spent in direct patient care.    Vesta Mixer, Montez Hageman., MD, [facility] 02/06/2021, 5:38 PM [address],  [address] Office [phone] Pager [phone]

## 2021-02-05 NOTE — Progress Notes (Signed)
Echo attempted. Nurse requested that echo be done later to accommodate scheduling for other tests. Will attempt again later as time and schedule permits.

## 2021-02-05 NOTE — Progress Notes (Signed)
  Echocardiogram 2D Echocardiogram has been performed.  Gerda Diss 02/05/2021, 9:40 AM

## 2021-02-05 NOTE — Progress Notes (Signed)
Progress Note  Patient Name: Kacee Koren Date of Encounter: 02/05/2021  [facility] Cardiologist:  Lydiah Pong   Subjective   25 yo, very healthy man Admitted with unstable angina symptoms following a very stressful argument . Transferred from [facility] ER  Coronary CTA today looks good.  Some artifact but no CAD or dissection  Echo :  overall looks good.  I'm concerned about the contractioity of the inferior basal apex. ? Mild case of Takotsubo that has already improved.   Inpatient Medications    Scheduled Meds:  cholecalciferol  1,000 Units Oral Q breakfast   heparin  5,000 Units Subcutaneous Q8H   lamoTRIgine  75 mg Oral QHS   sodium chloride flush  3 mL Intravenous Q12H   Continuous Infusions:  PRN Meds: metoprolol tartrate, nitroGLYCERIN   Vital Signs    Vitals:   02/04/21 1958 02/05/21 0024 02/05/21 0432 02/05/21 0753  BP: 108/70 (!) 101/59 109/64 106/70  Pulse: 68 67 (!) 53 63  Resp: 18 19 19    Temp: 98.4 F (36.9 C) 98.2 F (36.8 C) 97.8 F (36.6 C)   TempSrc: Oral Oral Oral   SpO2: 98% 97% 97%   Weight:      Height:       No intake or output data in the 24 hours ending 02/05/21 1058 Last 3 Weights 02/04/2021 02/04/2021 12/28/2020  Weight (lbs) 137 lb 6.4 oz 130 lb 130 lb  Weight (kg) 62.324 kg 58.968 kg 58.968 kg      Telemetry    - Personally Reviewed  ECG     - Personally Reviewed  Physical Exam   GEN: No acute distress.   Neck: No JVD Cardiac: RRR, no murmurs, rubs, or gallops.  Respiratory: Clear to auscultation bilaterally. GI: Soft, nontender, non-distended  MS: No edema; No deformity. Neuro:  Nonfocal  Psych: Normal affect   Labs    High Sensitivity Troponin:   Recent Labs  Lab 02/04/21 0427 02/04/21 0627 02/04/21 1500  TROPONINIHS 55* 79* 40*      Chemistry Recent Labs  Lab 02/04/21 0427 02/04/21 1633 02/05/21 0313  NA 139  --  135  K 4.2  --  3.8  CL 104  --  101  CO2 24  --  27  GLUCOSE 95  --  93  BUN  30*  --  24*  CREATININE 0.96 1.07 1.12  CALCIUM 9.5  --  9.4  GFRNONAA >60 >60 >60  ANIONGAP 11  --  7     Hematology Recent Labs  Lab 02/04/21 0427 02/04/21 1633 02/05/21 0313  WBC 7.4 3.3* 4.8  RBC 5.13 4.99 5.13  HGB 14.5 14.4 14.6  HCT 43.1 42.2 43.5  MCV 84.0 84.6 84.8  MCH 28.3 28.9 28.5  MCHC 33.6 34.1 33.6  RDW 12.2 12.2 12.3  PLT 237 236 233    BNPNo results for input(s): BNP, PROBNP in the last 168 hours.   DDimer No results for input(s): DDIMER in the last 168 hours.   Radiology    DG Chest 1 View  Result Date: 02/04/2021 CLINICAL DATA:  Chest pain and cough. EXAM: CHEST  1 VIEW COMPARISON:  10/05/2020 FINDINGS: The heart size and mediastinal contours are within normal limits. Both lungs are clear. The visualized skeletal structures are unremarkable. IMPRESSION: No active disease. Electronically Signed   By: 10/07/2020 M.D.   On: 02/04/2021 16:48   CT CORONARY MORPH W/CTA COR W/SCORE W/CA W/CM &/OR WO/CM  Addendum Date: 02/05/2021  ADDENDUM REPORT: 02/05/2021 10:34 CLINICAL DATA:  9M with chest pain, troponin elevation EXAM: Cardiac/Coronary CTA TECHNIQUE: The patient was scanned on a [employer]. FINDINGS: A 100 kV prospective scan was triggered in the descending thoracic aorta at 111 HU's. Axial non-contrast 3 mm slices were carried out through the heart. The data set was analyzed on a dedicated work station and scored using the Agatson method. Gantry rotation speed was 250 msecs and collimation was .6 mm. No beta blockade and 0.8 mg of sl NTG was given. The 3D data set was reconstructed in 5% intervals of the 35-75% of the R-R cycle. Diastolic and systolic phases were analyzed on a dedicated work station using MPR, MIP and VRT modes. The patient received 80 cc of contrast. Coronary Arteries:  Normal coronary origin.  Right dominance. RCA is a large dominant artery that gives rise to PDA and PLA. There is no plaque. Left main is a large artery that  gives rise to LAD and LCX arteries. LAD is a large vessel that has no plaque. LCX is a non-dominant artery that gives rise to one large OM1 branch. There is no plaque. Other findings: Left Ventricle: Normal size Left Atrium: Normal size Pulmonary Veins: Normal configuration Right Ventricle: Normal size Right Atrium: Normal size Cardiac valves: No calcifications Thoracic aorta: Normal size Pulmonary Arteries: Normal size Systemic Veins: Normal drainage Pericardium: Normal thickness IMPRESSION: 1. Coronary calcium score of 0. 2. Normal coronary origin with right dominance. 3. There are step artifacts due to motion artifact from irregular heart rhythm and respiratory motion, however no evidence of CAD seen CAD-RADS 0. No evidence of CAD (0%). Consider non-atherosclerotic causes of chest pain. Electronically Signed   By: Epifanio Lesches M.D.   On: 02/05/2021 10:34   Result Date: 02/05/2021 EXAM: OVER-READ INTERPRETATION  CT CHEST The following report is an over-read performed by radiologist Dr. Jeronimo Greaves of [facility] on 02/05/2021. This over-read does not include interpretation of cardiac or coronary anatomy or pathology. The coronary CTA interpretation by the cardiologist is attached. COMPARISON:  Chest radiograph 1 day prior FINDINGS: Vascular: Normal aortic caliber. No central pulmonary embolism, on this non-dedicated study. Mediastinum/Nodes: No imaged thoracic adenopathy. Lungs/Pleura: No pleural fluid.  Clear imaged lungs. Upper Abdomen: Normal imaged portions of the liver, spleen, stomach. Musculoskeletal: Mild bilateral gynecomastia. No acute osseous abnormality. IMPRESSION: 1.  No acute findings in the imaged extracardiac chest. 2. Mild bilateral gynecomastia Electronically Signed: By: Jeronimo Greaves M.D. On: 02/05/2021 09:24    Cardiac Studies     Patient Profile     25 y.o. male admitted with hours of chest heaviness  Assessment & Plan     Unstable angina :   + troponins.    Echo - the images of the apex were not seen very well.  In 1 image, Im concerned about mild hypokinesis of the inferior septal apex.   In other views, the apex appears to contract well.   This could have been a mild case of Takotsubo syndrome.    He is bradycardic at baseline - I do not think he will tolerate coreg.   OK for Dc today . I'll see him in follow up in a month ( or my APP)   I dont anticipate that he will need to be seen regularly by cardiology .    For questions or updates, please contact [facility] Please consult [email or web address] for contact info under        Signed, Loistine Chance  Mohammad Granade, MD  02/05/2021, 10:58 AM

## 2021-02-09 ENCOUNTER — Emergency Department (HOSPITAL_BASED_OUTPATIENT_CLINIC_OR_DEPARTMENT_OTHER): Payer: No Typology Code available for payment source | Admitting: Radiology

## 2021-02-09 ENCOUNTER — Encounter (HOSPITAL_BASED_OUTPATIENT_CLINIC_OR_DEPARTMENT_OTHER): Payer: Self-pay

## 2021-02-09 ENCOUNTER — Other Ambulatory Visit: Payer: Self-pay

## 2021-02-09 ENCOUNTER — Emergency Department (HOSPITAL_BASED_OUTPATIENT_CLINIC_OR_DEPARTMENT_OTHER)
Admission: EM | Admit: 2021-02-09 | Discharge: 2021-02-09 | Disposition: A | Discharge status: Home / Self Care | Payer: No Typology Code available for payment source | Attending: Emergency Medicine | Admitting: Emergency Medicine

## 2021-02-09 DIAGNOSIS — R072 Precordial pain: Secondary | ICD-10-CM | POA: Diagnosis present

## 2021-02-09 DIAGNOSIS — M940 Chondrocostal junction syndrome [Tietze]: Secondary | ICD-10-CM | POA: Diagnosis not present

## 2021-02-09 LAB — TROPONIN I (HIGH SENSITIVITY): Troponin I (High Sensitivity): 11 ng/L (ref <18)

## 2021-02-09 MED ORDER — KETOROLAC TROMETHAMINE 15 MG/ML IJ SOLN
15.0000 mg | Freq: Once | INTRAMUSCULAR | Status: AC
  Administered 2021-02-09: 15 mg via INTRAVENOUS
  Filled 2021-02-09: qty 1

## 2021-02-09 MED ORDER — NAPROXEN 375 MG PO TABS: ORAL_TABLET | ORAL | 0 refills | Status: DC

## 2021-02-09 NOTE — ED Triage Notes (Signed)
Patient here POV from Home with CP since 15-25 minutes ago.  Patient states he was attempting to sleep and he rolled over and began to experience Mid to Right Sided CP that radiates down to Lower Chest.  Mild Nausea. No Vomiting. No SOB. A&Ox4. GCS 15. NAD Noted during Triage. Ambulatory. No Medications PTA.

## 2021-02-09 NOTE — ED Provider Notes (Signed)
[facility] Provider Note: Kevin Dell, MD, FACEP  CSN: [id number] MRN: [id number] ARRIVAL: 02/09/21 at 0028 ROOM: [id number]   CHIEF COMPLAINT  Chest Pain   HISTORY OF PRESENT ILLNESS  02/09/21 12:39 AM Kevin [name] is a 25 y.o. male who was seen by myself 02/04/2021 for chest pain following an argument during which she had what he thought was a panic attack.  He had mildly elevated troponins and was admitted for cardiac work-up.  Although his second troponin was elevated here it followed a downward trend once he was admitted to the cardiology service.  He is CT calcium score was 0 and his echocardiogram was largely normal although there was a question of slight Takotsubo syndrome given some questionable mild apical akinesis.  He is here with a burning chest pain he had all day yesterday.  The chest pain is located to the right of his sternum and is worse with cough (he has had a cough on and off for the last 3 weeks and has been treated for bronchitis) or palpation.  He is not short of breath but continues to cough.  About 15 to 20 minutes prior to arrival he developed a sharp, stabbing pain on the left side of his sternum that was different than the burning pain.  He rated it as a 7 out of 10 and it was associated with some nausea and anxiety.  He was going to drive himself to the ED but got dizzy (lightheaded) and had a friend bring him.  That sharp pain has nearly resolved at this point but he continues to have the burning pain in his chest.   Past Medical History:  Diagnosis Date   Bipolar depression (HCC)    Thrombosed hemorrhoids     Past Surgical History:  Procedure Laterality Date   APPENDECTOMY      Family History  Problem Relation Age of Onset   Colon cancer Other        grandfather   Pancreatic cancer Other        aunt    Social History   Tobacco Use   Smoking status: Never   Smokeless tobacco: Never  Vaping Use   Vaping Use: Never used   Substance Use Topics   Alcohol use: Not Currently   Drug use: Never    Prior to Admission medications   Medication Sig Start Date End Date Taking? Authorizing Provider  naproxen (NAPROSYN) 375 MG tablet Take 1 tablet twice daily for chest wall pain (costochondritis). 02/09/21  Yes Angelus Hoopes, MD  cholecalciferol (VITAMIN D) 25 MCG (1000 UNIT) tablet Take 1,000 Units by mouth daily with breakfast. 06/01/20 06/02/21  [provider]  LAMICTAL 25 MG tablet Take 75 mg by mouth at bedtime.    [provider]    Allergies Patient has no known allergies.   REVIEW OF SYSTEMS  Negative except as noted here or in the History of Present Illness.   PHYSICAL EXAMINATION  Initial Vital Signs Blood pressure 125/76, pulse (!) 55, temperature 97.9 F (36.6 C), temperature source Oral, resp. rate 12, height 5\' 9"  (1.753 m), weight 60.8 kg, SpO2 100 %.  Examination General: Well-developed, well-nourished male in no acute distress; appearance consistent with age of record HENT: normocephalic; atraumatic Eyes: Normal appearance Neck: supple Heart: regular rate and rhythm; no murmur Lungs: clear to auscultation bilaterally Chest: Right parasternal tenderness that reproduces the burning pain described in the HPI Abdomen: soft; nondistended; nontender; bowel sounds present Extremities: No deformity;  full range of motion; pulses normal Neurologic: Awake, alert and oriented; motor function intact in all extremities and symmetric; no facial droop Skin: Warm and dry Psychiatric: Normal mood and affect   RESULTS  Summary of this visit's results, reviewed and interpreted by myself:   EKG Interpretation  Date/Time:  Thursday February 09 2021 00:35:27 EDT Ventricular Rate:  53 PR Interval:  147 QRS Duration: 91 QT Interval:  420 QTC Calculation: 395 R Axis:   66 Text Interpretation: Sinus rhythm ST elev, probable normal early repol pattern Inferior ST depressions no longer  present Confirmed by Paula Libra ([id number]) on 02/09/2021 12:38:14 AM       Laboratory Studies: Results for orders placed or performed during the hospital encounter of 02/09/21 (from the past 24 hour(s))  Troponin I (High Sensitivity)     Status: None   Collection Time: 02/09/21 12:56 AM  Result Value Ref Range   Troponin I (High Sensitivity) 11 <18 ng/L   Imaging Studies: DG Chest 2 View  Result Date: 02/09/2021 CLINICAL DATA:  Chest pain and cough for several weeks EXAM: CHEST - 2 VIEW COMPARISON:  02/04/2021 FINDINGS: The heart size and mediastinal contours are within normal limits. Both lungs are clear. The visualized skeletal structures are unremarkable. IMPRESSION: No active cardiopulmonary disease. Electronically Signed   By: Alcide Clever M.D.   On: 02/09/2021 01:15    ED COURSE and MDM  Nursing notes, initial and subsequent vitals signs, including pulse oximetry, reviewed and interpreted by myself.  Vitals:   02/09/21 0034 02/09/21 0036 02/09/21 0100  BP:  125/76 114/84  Pulse:  (!) 55 (!) 53  Resp:  12 11  Temp:  97.9 F (36.6 C)   TempSrc:  Oral   SpO2:  100% 100%  Weight: 60.8 kg    Height: 5\' 9"  (1.753 m)     Medications  ketorolac (TORADOL) 15 MG/ML injection 15 mg (15 mg Intravenous Given 02/09/21 0117)   1:53 AM The inferior ST depressions and elevated troponin seen on the patient's previous visit are not present on this visit.  His chest pain is reproducible and he had significant improvement with IV Toradol.  This is consistent with costochondritis.  We will start on naproxen.  If his symptoms worsen or change she should return to the ED.  Otherwise he has follow-up pending with Dr. 02/11/21 of cardiology.   PROCEDURES  Procedures   ED DIAGNOSES     ICD-10-CM   1. Costochondritis, acute  M94.0          Yoali Conry, Elease Hashimoto, MD 02/09/21 [phone]

## 2021-03-13 NOTE — Progress Notes (Deleted)
Cardiology Office Note    Date:  03/13/2021   ID:  Kevin [name], DOB 27-Aug-1995, MRN [id number]   PCP:  [facility]inic, [facility]  Cardiologist:  None *** Advanced Practice Provider:  No care team member to display Electrophysiologist:  None   [id number]}   No chief complaint on file.   History of Present Illness:  Kevin [name] is a 25 y.o. male with a PMH of bipolar disorder, PTSD, Crohn's disease who was admitted for chest pain and elevated troponins and abnormal EKG after an intense argument with his roommate triggering bad memory and PTSD.  Symptoms consistent with panic attack.  No family history of cardiac disease.  He is a long-distance runner.  No tobacco use or illicit drugs.  EKG with minimal ST depression in lead III and repolarization abnormality week 2 through V4.  Cardiac CTA calcium score 0 normal coronary origin with right dominance.  2D echo normal LVEF 55 to 60%, overall normal echo.  This was not resulted prior to discharge so he was added onto my schedule.    Past Medical History:  Diagnosis Date   Bipolar depression (HCC)    Thrombosed hemorrhoids     Past Surgical History:  Procedure Laterality Date   APPENDECTOMY      Current Medications: No outpatient medications have been marked as taking for the 03/15/21 encounter (Appointment) with Dyann Kief, PA-C.     Allergies:   Patient has no known allergies.   Social History   Socioeconomic History   Marital status: Single    Spouse name: Not on file   Number of children: Not on file   Years of education: Not on file   Highest education level: Not on file  Occupational History   Not on file  Tobacco Use   Smoking status: Never   Smokeless tobacco: Never  Vaping Use   Vaping Use: Never used  Substance and Sexual Activity   Alcohol use: Not Currently   Drug use: Never   Sexual activity: Not on file  Other Topics Concern   Not on file  Social  History Narrative   Not on file   Social Determinants of Health   Financial Resource Strain: Not on file  Food Insecurity: Not on file  Transportation Needs: Not on file  Physical Activity: Not on file  Stress: Not on file  Social Connections: Not on file     Family History:  The patient's ***family history includes Colon cancer in an other family member; Pancreatic cancer in an other family member.   ROS:   Please see the history of present illness.    ROS All other systems reviewed and are negative.   PHYSICAL EXAM:   VS:  There were no vitals taken for this visit.  Physical Exam  GEN: Well nourished, well developed, in no acute distress  HEENT: normal  Neck: no JVD, carotid bruits, or masses Cardiac:RRR; no murmurs, rubs, or gallops  Respiratory:  clear to auscultation bilaterally, normal work of breathing GI: soft, nontender, nondistended, + BS Ext: without cyanosis, clubbing, or edema, Good distal pulses bilaterally MS: no deformity or atrophy  Skin: warm and dry, no rash Neuro:  Alert and Oriented x 3, Strength and sensation are intact Psych: euthymic mood, full affect  Wt Readings from Last 3 Encounters:  02/09/21 134 lb (60.8 kg)  02/04/21 137 lb 6.4 oz (62.3 kg)  12/28/20 130 lb (59 kg)  Studies/Labs Reviewed:   EKG:  EKG is*** ordered today.  The ekg ordered today demonstrates ***  Recent Labs: 10/05/2020: ALT 10 02/05/2021: BUN 24; Creatinine, Ser 1.12; Hemoglobin 14.6; Platelets 233; Potassium 3.8; Sodium 135   Lipid Panel No results found for: CHOL, TRIG, HDL, CHOLHDL, VLDL, LDLCALC, LDLDIRECT  Additional studies/ records that were reviewed today include:   2D echo 02/05/2021 IMPRESSIONS     1. Left ventricular ejection fraction, by estimation, is 55 to 60%. The  left ventricle has normal function. The left ventricle has no regional  wall motion abnormalities. Left ventricular diastolic parameters were  normal.   2. Right ventricular  systolic function is normal. The right ventricular  size is normal. Tricuspid regurgitation signal is inadequate for assessing  PA pressure.   3. The mitral valve is grossly normal. Trivial mitral valve  regurgitation.   4. The aortic valve is tricuspid. Aortic valve regurgitation is not  visualized. No aortic stenosis is present. Aortic valve mean gradient  measures 2.0 mmHg.   5. The inferior vena cava is normal in size with greater than 50%  respiratory variability, suggesting right atrial pressure of 3 mmHg.   Comparison(s): No prior Echocardiogram. CCTA 02/05/21:   IMPRESSION: 1. Coronary calcium score of 0.   2. Normal coronary origin with right dominance.   3. There are step artifacts due to motion artifact from irregular heart rhythm and respiratory motion, however no evidence of CAD seen   CAD-RADS 0. No evidence of CAD (0%). Consider non-atherosclerotic causes of chest pain. _____________    Risk Assessment/Calculations:   {Does this patient have ATRIAL FIBRILLATION?[phone]}     ASSESSMENT:    1. Other chest pain   2. Bipolar affective disorder, remission status unspecified (HCC)      PLAN:  In order of problems listed above:  Chest pain with minimally elevated troponins and baseline abnormal EKG.  Cardiac CTA calcium score 0 no coronary disease 2D echo normal  Bipolar disorder  PTSD  Shared Decision Making/Informed Consent   {Are you ordering a CV Procedure (e.g. stress test, cath, DCCV, TEE, etc)?   Press F2        :[id number]}    Medication Adjustments/Labs and Tests Ordered: Current medicines are reviewed at length with the patient today.  Concerns regarding medicines are outlined above.  Medication changes, Labs and Tests ordered today are listed in the Patient Instructions below. There are no Patient Instructions on file for this visit.   Signed, Jacolyn Reedy, PA-C  03/13/2021 3:36 PM    [facility] [address]  [place], [place], [place]  [address] Phone: [phone]; Fax: [phone]

## 2021-03-15 ENCOUNTER — Ambulatory Visit: Payer: Non-veteran care | Admitting: Physician Assistant

## 2021-03-15 DIAGNOSIS — F319 Bipolar disorder, unspecified: Secondary | ICD-10-CM

## 2021-03-15 DIAGNOSIS — R0789 Other chest pain: Secondary | ICD-10-CM

## 2021-05-09 DIAGNOSIS — I861 Scrotal varices: Secondary | ICD-10-CM | POA: Diagnosis not present

## 2021-05-09 DIAGNOSIS — R0789 Other chest pain: Secondary | ICD-10-CM | POA: Diagnosis not present

## 2021-05-09 DIAGNOSIS — R931 Abnormal findings on diagnostic imaging of heart and coronary circulation: Secondary | ICD-10-CM | POA: Diagnosis not present

## 2021-05-09 DIAGNOSIS — N50811 Right testicular pain: Secondary | ICD-10-CM | POA: Diagnosis not present

## 2021-05-24 DIAGNOSIS — R059 Cough, unspecified: Secondary | ICD-10-CM | POA: Diagnosis not present

## 2021-06-04 ENCOUNTER — Emergency Department (HOSPITAL_BASED_OUTPATIENT_CLINIC_OR_DEPARTMENT_OTHER): Payer: No Typology Code available for payment source

## 2021-06-04 ENCOUNTER — Emergency Department (HOSPITAL_BASED_OUTPATIENT_CLINIC_OR_DEPARTMENT_OTHER)
Admission: EM | Admit: 2021-06-04 | Discharge: 2021-06-04 | Disposition: A | Discharge status: Home / Self Care | Payer: No Typology Code available for payment source | Attending: Emergency Medicine | Admitting: Emergency Medicine

## 2021-06-04 ENCOUNTER — Other Ambulatory Visit: Payer: Self-pay

## 2021-06-04 ENCOUNTER — Encounter (HOSPITAL_BASED_OUTPATIENT_CLINIC_OR_DEPARTMENT_OTHER): Payer: Self-pay

## 2021-06-04 DIAGNOSIS — M79632 Pain in left forearm: Secondary | ICD-10-CM | POA: Insufficient documentation

## 2021-06-04 DIAGNOSIS — S52502A Unspecified fracture of the lower end of left radius, initial encounter for closed fracture: Secondary | ICD-10-CM

## 2021-06-04 DIAGNOSIS — Y9351 Activity, roller skating (inline) and skateboarding: Secondary | ICD-10-CM | POA: Diagnosis not present

## 2021-06-04 DIAGNOSIS — W0110XA Fall on same level from slipping, tripping and stumbling with subsequent striking against unspecified object, initial encounter: Secondary | ICD-10-CM | POA: Insufficient documentation

## 2021-06-04 DIAGNOSIS — S59912A Unspecified injury of left forearm, initial encounter: Secondary | ICD-10-CM | POA: Diagnosis present

## 2021-06-04 MED ORDER — FENTANYL CITRATE PF 50 MCG/ML IJ SOSY
50.0000 ug | PREFILLED_SYRINGE | INTRAMUSCULAR | Status: DC | PRN
  Administered 2021-06-04: 19:00:00 50 ug via NASAL
  Filled 2021-06-04: qty 1

## 2021-06-04 MED ORDER — HYDROCODONE-ACETAMINOPHEN 5-325 MG PO TABS: 1.0000 | ORAL_TABLET | Freq: Four times a day (QID) | ORAL | 0 refills | Status: DC | PRN

## 2021-06-04 MED ORDER — HYDROCODONE-ACETAMINOPHEN 5-325 MG PO TABS
1.0000 | ORAL_TABLET | Freq: Once | ORAL | Status: DC
Start: 2021-06-04 — End: 2021-06-05
  Filled 2021-06-04: qty 1

## 2021-06-04 NOTE — ED Provider Notes (Signed)
[facility] EMERGENCY DEPARTMENT Provider Note  CSN: [id number] Arrival date & time: 06/04/21 1908    History Chief Complaint  Patient presents with   Arm Injury    Quaran Kevin [name] is a 25 y.o. male reports he fell while skating a short time ago, landing on his L arm, unsure exactly how he landed, does not think it was outstretched hand. He states his forearm bent at a strange angle and he thinks it may be broken. He denies any other injuries. Hurts to move his arm and fingers.    Past Medical History:  Diagnosis Date   Bipolar depression (HCC)    Thrombosed hemorrhoids     Past Surgical History:  Procedure Laterality Date   APPENDECTOMY      Family History  Problem Relation Age of Onset   Colon cancer Other        grandfather   Pancreatic cancer Other        aunt    Social History   Tobacco Use   Smoking status: Never   Smokeless tobacco: Never  Vaping Use   Vaping Use: Never used  Substance Use Topics   Alcohol use: Not Currently   Drug use: Never     Home Medications Prior to Admission medications   Medication Sig Start Date End Date Taking? Authorizing Provider  HYDROcodone-acetaminophen (NORCO/VICODIN) 5-325 MG tablet Take 1 tablet by mouth every 6 (six) hours as needed for severe pain. 06/04/21  Yes Pollyann Savoy, MD  LAMICTAL 25 MG tablet Take 75 mg by mouth at bedtime.    [provider]  naproxen (NAPROSYN) 375 MG tablet Take 1 tablet twice daily for chest wall pain (costochondritis). 02/09/21   Molpus, Jonny Ruiz, MD     Allergies    Patient has no known allergies.   Review of Systems   Review of Systems A comprehensive review of systems was completed and negative except as noted in HPI.    Physical Exam BP 118/72    Pulse (!) 59    Temp 98 F (36.7 C) (Oral)    Resp 18    Ht 5\' 9"  (1.753 m)    Wt 63.5 kg    SpO2 100%    BMI 20.67 kg/m   Physical Exam Vitals and nursing note reviewed.  HENT:     Head:  Normocephalic.     Nose: Nose normal.  Eyes:     Extraocular Movements: Extraocular movements intact.  Pulmonary:     Effort: Pulmonary effort is normal.  Musculoskeletal:        General: Tenderness (L forearm and L radial wrist) present. No swelling or deformity. Normal range of motion.     Cervical back: Neck supple.     Comments: Normal distal pulse  Skin:    Findings: No rash (on exposed skin).  Neurological:     Mental Status: He is alert and oriented to person, place, and time.  Psychiatric:        Mood and Affect: Mood normal.     ED Results / Procedures / Treatments   Labs (all labs ordered are listed, but only abnormal results are displayed) Labs Reviewed - No data to display  EKG None   Radiology DG Forearm Left  Result Date: 06/04/2021 CLINICAL DATA:  Fall at a skating rink. EXAM: LEFT FOREARM - 2 VIEW; LEFT WRIST - COMPLETE 3+ VIEW COMPARISON:  None. FINDINGS: There is a nondisplaced transverse fracture involving the distal radius. The ulna appears intact.  No evidence of dislocation. Evaluation of the carpal bones is somewhat limited by patient positioning but there is no definite acute finding. Distal soft tissue swelling. IMPRESSION: Nondisplaced transverse fracture involving the distal radius. Electronically Signed   By: Emmaline Kluver M.D.   On: 06/04/2021 20:07   DG Wrist Complete Left  Result Date: 06/04/2021 CLINICAL DATA:  Fall at a skating rink. EXAM: LEFT FOREARM - 2 VIEW; LEFT WRIST - COMPLETE 3+ VIEW COMPARISON:  None. FINDINGS: There is a nondisplaced transverse fracture involving the distal radius. The ulna appears intact. No evidence of dislocation. Evaluation of the carpal bones is somewhat limited by patient positioning but there is no definite acute finding. Distal soft tissue swelling. IMPRESSION: Nondisplaced transverse fracture involving the distal radius. Electronically Signed   By: Emmaline Kluver M.D.   On: 06/04/2021 20:07     Procedures Procedures  Medications Ordered in the ED Medications  fentaNYL (SUBLIMAZE) injection 50 mcg (50 mcg Nasal Given 06/04/21 1920)  HYDROcodone-acetaminophen (NORCO/VICODIN) 5-325 MG per tablet 1 tablet (1 tablet Oral Not Given 06/04/21 2036)     MDM Rules/Calculators/A&P MDM Patient here with L forearm/wrist injury. He is requesting to be 'put to sleep' to have his bone set. I advised we will get an xray first to determine what, if any, bony injuries he has.   ED Course  I have reviewed the triage vital signs and the nursing notes.  Pertinent labs & imaging results that were available during my care of the patient were reviewed by me and considered in my medical decision making (see chart for details).  Clinical Course as of 06/04/21 2050  Wynelle Link Jun 04, 2021  2029 Xray images and results reviewed, nondisplaced distal radius fx, will order a sugar tong splint. Pain meds and plan Ortho/Hand follow up.  [CS]    Clinical Course User Index [CS] Pollyann Savoy, MD    Final Clinical Impression(s) / ED Diagnoses Final diagnoses:  Closed fracture of distal end of left radius, unspecified fracture morphology, initial encounter    Rx / DC Orders ED Discharge Orders          Ordered    HYDROcodone-acetaminophen (NORCO/VICODIN) 5-325 MG tablet  Every 6 hours PRN        06/04/21 2049             Pollyann Savoy, MD 06/04/21 2050

## 2021-06-04 NOTE — ED Notes (Signed)
Pt verbalizes understanding of discharge instructions. Opportunity for questioning and answers were provided. Pt discharged from ED to home.   ? ?

## 2021-06-04 NOTE — ED Triage Notes (Signed)
Pt fell while at a skating rink. Pt c/o pain to the L forearm. Pt thinks it is fx.

## 2021-06-06 ENCOUNTER — Ambulatory Visit (INDEPENDENT_AMBULATORY_CARE_PROVIDER_SITE_OTHER): Payer: No Typology Code available for payment source

## 2021-06-06 ENCOUNTER — Ambulatory Visit (INDEPENDENT_AMBULATORY_CARE_PROVIDER_SITE_OTHER): Payer: 59 | Admitting: Orthopedic Surgery

## 2021-06-06 ENCOUNTER — Other Ambulatory Visit: Payer: Self-pay

## 2021-06-06 ENCOUNTER — Encounter: Payer: Self-pay | Admitting: Orthopedic Surgery

## 2021-06-06 VITALS — BP 139/80 | HR 67 | Ht 69.0 in | Wt 135.0 lb

## 2021-06-06 DIAGNOSIS — S52552A Other extraarticular fracture of lower end of left radius, initial encounter for closed fracture: Secondary | ICD-10-CM

## 2021-06-06 DIAGNOSIS — M25532 Pain in left wrist: Secondary | ICD-10-CM

## 2021-06-06 DIAGNOSIS — S52502A Unspecified fracture of the lower end of left radius, initial encounter for closed fracture: Secondary | ICD-10-CM | POA: Insufficient documentation

## 2021-06-06 NOTE — Progress Notes (Signed)
Office Visit Note   Patient: Kevin [name]           Date of Birth: [birth year]           MRN: [id number] Visit Date: 06/06/2021              Requested by: Kevin [name] [address] [facility] [place],  [place] [address] PCP: [facility]   Assessment & Plan: Visit Diagnoses:  1. Left wrist pain   2. Other closed extra-articular fracture of distal end of left radius, initial encounter     Plan: Reviewed x-rays with patient which demonstrates a nondisplaced extra-articular distal radius fracture.  Reviewed treatment options including ORIF with plate and screws.  Patient is not interested in surgery at this time and wants to proceed with cast immobilization.  We discussed the risks and benefits of both surgical and conservative treatment.  I would like to see him again next week for a repeat x-rays to make sure the fracture alignment is maintained in the cast.   Follow-Up Instructions: No follow-ups on file.   Orders:  Orders Placed This Encounter  Procedures   XR Wrist 2 Views Left   No orders of the defined types were placed in this encounter.     Procedures: Casting  Date/Time: 06/06/2021 9:04 AM Performed by: Marlyne Beards, MD Authorized by: Marlyne Beards, MD   Consent Given by:  Patient Location:  Wrist  left wrist Fracture Type: distal radius   Neurovascularly intact   Distal Perfusion: normal   Distal Sensation: normal   Manipulation Performed?: No   Immobilization:  Cast Is this the patient's first cast for this injury?: Yes   Cast Type:  Short arm Supplies Used:  Fiberglass and cotton padding Neurovascularly intact   Distal Perfusion: normal   Distal Sensation: normal   Patient tolerance:  Patient tolerated the procedure well with no immediate complications   Clinical Data: No additional findings.   Subjective: Chief Complaint  Patient presents with   Left Wrist - Fracture, Injury    This is a 25 yo LHD M  who presents with a closed, nondisplaced left distal radius fracture after a fall skateboarding on Saturday.  He was seen in the ER and placed in a splint.  He describes pain in the wrist that is worse w/ any attempted ROM.  He denies pain elsewhere in the extremity.  He denies any numbness or paresthesias.  Injury   Review of Systems   Objective: Vital Signs: BP 139/80 (BP Location: Right Arm, Patient Position: Sitting)    Pulse 67    Ht 5\' 9"  (1.753 m)    Wt 135 lb (61.2 kg)    BMI 19.94 kg/m   Physical Exam Constitutional:      Appearance: Normal appearance.  Cardiovascular:     Rate and Rhythm: Normal rate.     Pulses: Normal pulses.  Pulmonary:     Effort: Pulmonary effort is normal.  Skin:    General: Skin is warm and dry.     Capillary Refill: Capillary refill takes less than 2 seconds.  Neurological:     Mental Status: He is alert.    Left Hand Exam   Tenderness  Left hand tenderness location: TTP at wrist w/ moderate swelling.  No deformity.   Other  Erythema: absent Sensation: normal Pulse: present  Comments:  ROM of wrist and fingers limited by wrist pain.  No pain w/ palpation of forearm or elbow.  Specialty Comments:  No specialty comments available.  Imaging: Multiple views of the left wrist from the ER are reviewed and interpreted by me.  They demonstrate a nondisplaced extra-articular distal radius fracture.  Repeat x-rays today are also reviewed and interpreted by me.  They demonstrate a nondisplaced distal radius fracture.  Live fluoroscopy used to further evaluate the lateral view which demonstrates no fracture displacement or articular involvement w/ anatomic volar tilt.    PMFS History: Patient Active Problem List   Diagnosis Date Noted   Closed fracture of left distal radius 06/06/2021   Unstable angina (HCC) 02/04/2021   Chest pain 02/04/2021   Past Medical History:  Diagnosis Date   Bipolar depression (HCC)    Thrombosed  hemorrhoids     Family History  Problem Relation Age of Onset   Colon cancer Other        grandfather   Pancreatic cancer Other        aunt    Past Surgical History:  Procedure Laterality Date   APPENDECTOMY     Social History   Occupational History   Not on file  Tobacco Use   Smoking status: Never   Smokeless tobacco: Never  Vaping Use   Vaping Use: Never used  Substance and Sexual Activity   Alcohol use: Not Currently   Drug use: Never   Sexual activity: Not on file

## 2021-06-13 ENCOUNTER — Ambulatory Visit (INDEPENDENT_AMBULATORY_CARE_PROVIDER_SITE_OTHER): Payer: No Typology Code available for payment source | Admitting: Orthopedic Surgery

## 2021-06-13 ENCOUNTER — Encounter: Payer: No Typology Code available for payment source | Admitting: Orthopedic Surgery

## 2021-06-13 ENCOUNTER — Ambulatory Visit (INDEPENDENT_AMBULATORY_CARE_PROVIDER_SITE_OTHER): Payer: No Typology Code available for payment source

## 2021-06-13 ENCOUNTER — Other Ambulatory Visit: Payer: Self-pay

## 2021-06-13 DIAGNOSIS — S52552A Other extraarticular fracture of lower end of left radius, initial encounter for closed fracture: Secondary | ICD-10-CM

## 2021-06-13 DIAGNOSIS — M25532 Pain in left wrist: Secondary | ICD-10-CM

## 2021-06-13 NOTE — Progress Notes (Signed)
Office Visit Note   Patient: Kevin [name]           Date of Birth: [birth year]           MRN: [id number] Visit Date: 06/13/2021              Requested by: Jeanine Luz [address] [facility] [place],  [place] [address] PCP: [facility]   Assessment & Plan: Visit Diagnoses:  1. Left wrist pain   2. Other closed extra-articular fracture of distal end of left radius, initial encounter     Plan: Repeat x-rays, including live fluoroscopic exam, demonstrate maintained reduction of this nondisplaced left distal radius fracture.  We again discussed the risks and benefits of both operative and nonoperative treatment.  He still wants to avoid surgery.  We will replace his cast today as some of his swelling has subsided since his last visit.  I'll see him again next week for repeat imaging.   Follow-Up Instructions: No follow-ups on file.   Orders:  Orders Placed This Encounter  Procedures   XR Wrist 2 Views Left   No orders of the defined types were placed in this encounter.     Procedures: No procedures performed   Clinical Data: No additional findings.   Subjective: Chief Complaint  Patient presents with   Left Wrist - Follow-up, Fracture    This is a 25 yo LHD M who presents with a closed, nondisplaced left distal radius fracture after a fall skateboarding on 12/18.  His pain is improved from our initial visit.  He has tolerated the cast without difficulty.  He denies any numbness or paresthesias.   Review of Systems   Objective: Vital Signs: There were no vitals taken for this visit.  Physical Exam Constitutional:      Appearance: Normal appearance.  Cardiovascular:     Rate and Rhythm: Normal rate.     Pulses: Normal pulses.  Pulmonary:     Effort: Pulmonary effort is normal.  Skin:    General: Skin is warm and dry.     Capillary Refill: Capillary refill takes less than 2 seconds.  Neurological:     Mental Status: He is  alert.    Left Hand Exam   Other  Erythema: absent Sensation: normal Pulse: present  Comments:  Able to flex/extend fingers within limits of cast.  Fingers warm and well perfused w/ SILT.      Specialty Comments:  No specialty comments available.  Imaging: AP and lateral views of the left wrist taken today are reviewed and interpreted by me.  They demonstrate maintained reduction of the nondisplaced left distal radius fracture.  Repeat PA views taken under live fluoroscopy given rotation on formal films.  They demonstrate maintained alignment w/ anatomic radial height and inclination.  Repeat 30 deg lateral view demonstrates maintained volar tilt.    PMFS History: Patient Active Problem List   Diagnosis Date Noted   Closed fracture of left distal radius 06/06/2021   Unstable angina (HCC) 02/04/2021   Chest pain 02/04/2021   Past Medical History:  Diagnosis Date   Bipolar depression (HCC)    Thrombosed hemorrhoids     Family History  Problem Relation Age of Onset   Colon cancer Other        grandfather   Pancreatic cancer Other        aunt    Past Surgical History:  Procedure Laterality Date   APPENDECTOMY     Social History  Occupational History   Not on file  Tobacco Use   Smoking status: Never   Smokeless tobacco: Never  Vaping Use   Vaping Use: Never used  Substance and Sexual Activity   Alcohol use: Not Currently   Drug use: Never   Sexual activity: Not on file

## 2021-06-20 ENCOUNTER — Ambulatory Visit: Payer: No Typology Code available for payment source | Admitting: Orthopedic Surgery

## 2021-06-23 ENCOUNTER — Ambulatory Visit (INDEPENDENT_AMBULATORY_CARE_PROVIDER_SITE_OTHER): Payer: 59 | Admitting: Orthopedic Surgery

## 2021-06-23 ENCOUNTER — Other Ambulatory Visit: Payer: Self-pay

## 2021-06-23 ENCOUNTER — Ambulatory Visit (INDEPENDENT_AMBULATORY_CARE_PROVIDER_SITE_OTHER): Payer: 59

## 2021-06-23 DIAGNOSIS — S52552A Other extraarticular fracture of lower end of left radius, initial encounter for closed fracture: Secondary | ICD-10-CM

## 2021-06-23 DIAGNOSIS — M25532 Pain in left wrist: Secondary | ICD-10-CM

## 2021-06-23 NOTE — Progress Notes (Signed)
Office Visit Note   Patient: Kevin [name]           Date of Birth: 18-Feb-1996           MRN: [id number] Visit Date: 06/23/2021              Requested by: Jeanine Luz [address] [facility] [place],  [place] [address] PCP: [facility]   Assessment & Plan: Visit Diagnoses:  1. Left wrist pain   2. Other closed extra-articular fracture of distal end of left radius, initial encounter     Plan: Repeat x-rays today show unchanged fracture alignment.  The AP view is anatomic.  There is maintained volar tilt on the lateral view.  He has no discomfort or issue with his cast.  I can see him back in another week for repeat x-ray in the cast.   Follow-Up Instructions: No follow-ups on file.   Orders:  Orders Placed This Encounter  Procedures   XR Wrist Complete Left   No orders of the defined types were placed in this encounter.     Procedures: No procedures performed   Clinical Data: No additional findings.   Subjective: Chief Complaint  Patient presents with   Left Wrist - Follow-up, Fracture    Says that it is really sore, cast is fitting well    This is a 26 yo LHD M who presents for follow up of a closed, nondisplaced left distal radius fracture after a fall skateboarding on 12/18.  After discussing surgical verus non-surgical management, he wanted to try to treat this nonoperatively in a cast.  He has tolerated the cast without difficulty.  He denies any numbness or paresthesias   Review of Systems   Objective: Vital Signs: There were no vitals taken for this visit.  Physical Exam Constitutional:      Appearance: Normal appearance.  Cardiovascular:     Rate and Rhythm: Normal rate.     Pulses: Normal pulses.  Pulmonary:     Effort: Pulmonary effort is normal.  Skin:    General: Skin is warm.     Capillary Refill: Capillary refill takes less than 2 seconds.  Neurological:     Mental Status: He is alert.    Left Hand  Exam   Other  Erythema: absent Sensation: normal Pulse: present  Comments:  Cast clean and dry.  Well fitting.  Able to flex and extend fingers within limits of cast.      Specialty Comments:  No specialty comments available.  Imaging: 3V of the left wrist taken today are reviewed and interpreted by me.  They demonstrate a nondisplaced extra-articular metaphyseal fracture with no change in alignment from previous imaging.    PMFS History: Patient Active Problem List   Diagnosis Date Noted   Closed fracture of left distal radius 06/06/2021   Unstable angina (HCC) 02/04/2021   Chest pain 02/04/2021   Past Medical History:  Diagnosis Date   Bipolar depression (HCC)    Thrombosed hemorrhoids     Family History  Problem Relation Age of Onset   Colon cancer Other        grandfather   Pancreatic cancer Other        aunt    Past Surgical History:  Procedure Laterality Date   APPENDECTOMY     Social History   Occupational History   Not on file  Tobacco Use   Smoking status: Never   Smokeless tobacco: Never  Vaping Use  Vaping Use: Never used  Substance and Sexual Activity   Alcohol use: Not Currently   Drug use: Never   Sexual activity: Not on file

## 2021-06-30 ENCOUNTER — Ambulatory Visit: Payer: Self-pay

## 2021-06-30 ENCOUNTER — Ambulatory Visit (INDEPENDENT_AMBULATORY_CARE_PROVIDER_SITE_OTHER): Payer: No Typology Code available for payment source | Admitting: Orthopedic Surgery

## 2021-06-30 ENCOUNTER — Ambulatory Visit (INDEPENDENT_AMBULATORY_CARE_PROVIDER_SITE_OTHER): Payer: No Typology Code available for payment source

## 2021-06-30 ENCOUNTER — Other Ambulatory Visit: Payer: Self-pay

## 2021-06-30 DIAGNOSIS — S52552A Other extraarticular fracture of lower end of left radius, initial encounter for closed fracture: Secondary | ICD-10-CM

## 2021-06-30 NOTE — Progress Notes (Signed)
° °  Office Visit Note   Patient: Kevin [name]           Date of Birth: 26-Nov-1995           MRN: [id number] Visit Date: 06/30/2021              Requested by: Jeanine Luz [address] [facility] [place],  [place] [address] PCP: [facility]   Assessment & Plan: Visit Diagnoses:  1. Other closed extra-articular fracture of distal end of left radius, initial encounter     Plan: Patient is doing well in the cast.  Repeat x-rays and live fluoroscopy today show maintained alignement of his nondisplaced fracture on both AP and lateral views.  We will exchange his cast today as it has gotten somewhat uncomfortable.  I can see him back next week for another x-ray after which time we can space out our visits.   Follow-Up Instructions: No follow-ups on file.   Orders:  Orders Placed This Encounter  Procedures   XR Wrist Complete Left   XR C-ARM NO REPORT   No orders of the defined types were placed in this encounter.     Procedures: No procedures performed   Clinical Data: No additional findings.   Subjective: Chief Complaint  Patient presents with   Left Wrist - Follow-up    This is a 26 yo LHD M who presents for follow up of a closed, nondisplaced left distal radius fracture after a fall skateboarding on 12/18.  He has elected to treat this fracture nonoperatively in a short arm cast.  He has tolerated the cast without difficulty.  He has mild ulnar sided wrist pain today but otherwise has no complaints.    Review of Systems   Objective: Vital Signs: There were no vitals taken for this visit.  Physical Exam  Right Hand Exam   Tenderness  Right hand tenderness location: TTP at dorsal wrist.  Other  Erythema: absent Sensation: normal Pulse: present  Comments:  ROM limited by mild pain and immobilization related stiffness.      Specialty Comments:  No specialty comments available.  Imaging: Multiple views of the right wrist,  including live fluorscopic imaging, taken today are reviewed and interpreted by me.  They demonstrate maintained alignement of his nondisplaced extra-articular distal radius fracture in the cast.    PMFS History: Patient Active Problem List   Diagnosis Date Noted   Closed fracture of left distal radius 06/06/2021   Unstable angina (HCC) 02/04/2021   Chest pain 02/04/2021   Past Medical History:  Diagnosis Date   Bipolar depression (HCC)    Thrombosed hemorrhoids     Family History  Problem Relation Age of Onset   Colon cancer Other        grandfather   Pancreatic cancer Other        aunt    Past Surgical History:  Procedure Laterality Date   APPENDECTOMY     Social History   Occupational History   Not on file  Tobacco Use   Smoking status: Never   Smokeless tobacco: Never  Vaping Use   Vaping Use: Never used  Substance and Sexual Activity   Alcohol use: Not Currently   Drug use: Never   Sexual activity: Not on file

## 2021-07-02 ENCOUNTER — Other Ambulatory Visit: Payer: Self-pay

## 2021-07-02 ENCOUNTER — Encounter (HOSPITAL_BASED_OUTPATIENT_CLINIC_OR_DEPARTMENT_OTHER): Payer: Self-pay

## 2021-07-02 DIAGNOSIS — Y9351 Activity, roller skating (inline) and skateboarding: Secondary | ICD-10-CM | POA: Insufficient documentation

## 2021-07-02 DIAGNOSIS — S8991XA Unspecified injury of right lower leg, initial encounter: Secondary | ICD-10-CM | POA: Diagnosis present

## 2021-07-02 DIAGNOSIS — M25569 Pain in unspecified knee: Secondary | ICD-10-CM

## 2021-07-02 DIAGNOSIS — S8001XA Contusion of right knee, initial encounter: Secondary | ICD-10-CM | POA: Insufficient documentation

## 2021-07-02 NOTE — ED Triage Notes (Signed)
Pt presents to the ED with right knee pain. 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 Swelling noted. Pt ambulatory to triage room.

## 2021-07-03 ENCOUNTER — Emergency Department (HOSPITAL_BASED_OUTPATIENT_CLINIC_OR_DEPARTMENT_OTHER): Payer: No Typology Code available for payment source

## 2021-07-03 ENCOUNTER — Emergency Department (HOSPITAL_BASED_OUTPATIENT_CLINIC_OR_DEPARTMENT_OTHER)
Admission: EM | Admit: 2021-07-03 | Discharge: 2021-07-03 | Disposition: A | Discharge status: Home / Self Care | Payer: No Typology Code available for payment source | Attending: Emergency Medicine | Admitting: Emergency Medicine

## 2021-07-03 DIAGNOSIS — M25569 Pain in unspecified knee: Secondary | ICD-10-CM

## 2021-07-03 DIAGNOSIS — Z043 Encounter for examination and observation following other accident: Secondary | ICD-10-CM | POA: Diagnosis not present

## 2021-07-03 DIAGNOSIS — S8001XA Contusion of right knee, initial encounter: Secondary | ICD-10-CM

## 2021-07-03 NOTE — Discharge Instructions (Signed)
You were seen today for a knee injury.  Your x-ray and CT imaging did not show any fracture.  This does not rule out ligamentous injury but your symptoms may be related to simple contusion.  Ice and elevate.  Follow-up with sports medicine for ongoing discomfort.

## 2021-07-03 NOTE — ED Notes (Signed)
Pt verbalizes understanding of discharge instructions. Opportunity for questioning and answers were provided. Pt discharged from ED to home.   ? ?

## 2021-07-03 NOTE — ED Provider Notes (Signed)
[facility]   CSN: [id number] Arrival date & time: 07/02/21  2321     History  Chief Complaint  Patient presents with   Leg Pain    Kevin [name] is a 26 y.o. male.  HPI     This is a 26 year old male with no reported past medical history who presents with right knee injury.  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  He fell directly onto his right knee.  He has been ambulatory but states that he has had increasing pain and feels like something is disrupted in his knee.  No numbness or tingling of the foot.  He has not taken anything for his pain.  Home Medications Prior to Admission medications   Medication Sig Start Date End Date Taking? Authorizing Provider  HYDROcodone-acetaminophen (NORCO/VICODIN) 5-325 MG tablet Take 1 tablet by mouth every 6 (six) hours as needed for severe pain. 06/04/21   Kevin [name]  LAMICTAL 25 MG tablet Take 75 mg by mouth at bedtime.    Provider, Historical, [name]  naproxen (NAPROSYN) 375 MG tablet Take 1 tablet twice daily for chest wall pain (costochondritis). 02/09/21   Kevin [name]      Allergies    Patient has no known allergies.    Review of Systems   Review of Systems  Musculoskeletal:        Knee pain  All other systems reviewed and are negative.  Physical Exam Updated Vital Signs BP 111/65    Pulse (!) 57    Temp 98.5 F (36.9 C)    Resp 18    Ht 1.753 m (5\' 9" )    Wt 61.2 kg    SpO2 98%    BMI 19.94 kg/m  Physical Exam Vitals and nursing note reviewed.  Constitutional:      Appearance: He is well-developed. He is not ill-appearing.  HENT:     Head: Normocephalic and atraumatic.     Mouth/Throat:     Mouth: Mucous membranes are moist.  Cardiovascular:     Rate and Rhythm: Normal rate and regular rhythm.  Pulmonary:     Effort: Pulmonary effort is normal. No respiratory distress.  Abdominal:     Palpations: Abdomen is soft.  Musculoskeletal:     Cervical back:  Neck supple.     Comments: Normal range of motion of the right knee, there is significant tenderness to palpation around the medial joint line, contusion noted anterior medial to the patella, quadriceps tendon intact  Lymphadenopathy:     Cervical: No cervical adenopathy.  Skin:    General: Skin is warm and dry.  Neurological:     Mental Status: He is alert and oriented to person, place, and time.  Psychiatric:        Mood and Affect: Mood normal.    ED Results / Procedures / Treatments   Labs (all labs ordered are listed, but only abnormal results are displayed) Labs Reviewed - No data to display  EKG None  Radiology CT Knee Right Wo Contrast  Result Date: 07/03/2021 CLINICAL DATA:  Fall EXAM: CT OF THE RIGHT KNEE WITHOUT CONTRAST TECHNIQUE: Multidetector CT imaging of the RIGHT knee was performed according to the standard protocol. Multiplanar CT image reconstructions were also generated. RADIATION DOSE REDUCTION: This exam was performed according to the departmental dose-optimization program which includes automated exposure control, adjustment of the mA and/or kV according to patient size and/or use of iterative reconstruction technique. COMPARISON:  None. FINDINGS: Bones/Joint/Cartilage There is no acute fracture. Ligaments Suboptimally assessed by CT. Muscles and Tendons Unremarkable Soft tissues Moderate edema within the subcutaneous tissues of the medial right knee. IMPRESSION: Moderate edema within the subcutaneous tissues of the medial right knee. No acute fracture or dislocation. Electronically Signed   By: Kevin [name] M.D.   On: 07/03/2021 03:44   DG Knee Complete 4 Views Right  Result Date: 07/03/2021 CLINICAL DATA:  Right knee pain following fall, initial encounter EXAM: RIGHT KNEE - COMPLETE 4+ VIEW COMPARISON:  None. FINDINGS: No evidence of fracture, dislocation, or joint effusion. No evidence of arthropathy or other focal bone abnormality. Soft tissues are unremarkable.  IMPRESSION: No acute abnormality noted. Electronically Signed   By: Kevin Clever M.D.   On: 07/03/2021 02:09    Procedures Procedures    Medications Ordered in ED Medications - No data to display  ED Course/ Medical Decision Making/ A&P Clinical Course as of 07/03/21 0353  Mon Jul 03, 2021  0232 X-ray independently reviewed myself.  Read as negative.  Feel there may be a small medial defect which correlates with his significant joint line tenderness.  Will obtain CT scan to rule out occult tibial plateau fracture. [CH]    Clinical Course User Index [CH] Kevin [name]                           Medical Decision Making  This patient presents to the ED for concern of knee injury, this involves an extensive number of treatment options, and is a complaint that carries with it a high risk of complications and morbidity.  The differential diagnosis includes contusion, fracture, ligamentous injury  MDM:    This is a 26 year old male who presents with right knee injury.  Reports direct fall onto the right knee after skating.  He is nontoxic vital signs are reassuring.  Full range of motion of the knee.  He does have exquisite joint line tenderness and a notable contusion of the skin.  He is neurovascularly intact.  X-rays obtained and independently reviewed.  Read as negative.  Given tenderness along the joint line and notable skin injury at the location of the tibial plateau, would be at increased risk for occult tibial plateau fracture.  For this reason, will obtain CT imaging.  CT imaging read as negative.  Patient recommended to use ice to contusion.  Follow-up with sports medicine.  (Labs, imaging)  Labs: I Ordered, and personally interpreted labs.  The pertinent results include: None  Imaging Studies ordered: I ordered imaging studies including x-ray knee, CT knee negative I independently visualized and interpreted imaging. I agree with the radiologist  interpretation  Additional history obtained from none.  External records from outside source obtained and reviewed including none  Critical Interventions: N/A  Consultations: I requested consultation with the NA,  and discussed lab and imaging findings as well as pertinent plan - they recommend: N/A  Cardiac Monitoring: The patient was maintained on a cardiac monitor.  I personally viewed and interpreted the cardiac monitored which showed an underlying rhythm of: N/A  Reevaluation: After the interventions noted above, I reevaluated the patient and found that they have :stayed the same  Social Determinants of Health: Active, lives independently  Disposition: Discharge  Co morbidities that complicate the patient evaluation  Past Medical History:  Diagnosis Date   Bipolar depression (HCC)    Thrombosed hemorrhoids      Medicines  No orders of the defined types were placed in this encounter.   I have reviewed the patients home medicines and have made adjustments as needed  Problem List / ED Course: Problem List Items Addressed This Visit   None Visit Diagnoses     Contusion of right knee, initial encounter    -  Primary   Knee pain       Relevant Orders   DG Knee Complete 4 Views Right (Completed)                   Final Clinical Impression(s) / ED Diagnoses Final diagnoses:  Contusion of right knee, initial encounter    Rx / DC Orders ED Discharge Orders     None         Shon Baton, [name] 07/03/21 [phone]

## 2021-07-07 ENCOUNTER — Ambulatory Visit: Payer: No Typology Code available for payment source | Admitting: Orthopedic Surgery

## 2021-07-21 ENCOUNTER — Encounter: Payer: Self-pay | Admitting: Orthopedic Surgery

## 2021-07-21 ENCOUNTER — Other Ambulatory Visit: Payer: Self-pay

## 2021-07-21 ENCOUNTER — Ambulatory Visit (INDEPENDENT_AMBULATORY_CARE_PROVIDER_SITE_OTHER): Payer: 59

## 2021-07-21 ENCOUNTER — Ambulatory Visit (INDEPENDENT_AMBULATORY_CARE_PROVIDER_SITE_OTHER): Payer: 59 | Admitting: Orthopedic Surgery

## 2021-07-21 DIAGNOSIS — S52552A Other extraarticular fracture of lower end of left radius, initial encounter for closed fracture: Secondary | ICD-10-CM | POA: Diagnosis not present

## 2021-07-21 NOTE — Progress Notes (Signed)
° °  Office Visit Note   Patient: Kevin [name]           Date of Birth: 02/17/1996           MRN: [id number] Visit Date: 07/21/2021              Requested by: Jeanine Luz [address] [facility] [place],  [place] [address] PCP: [facility]   Assessment & Plan: Visit Diagnoses:  1. Other closed extra-articular fracture of distal end of left radius, initial encounter     Plan: Patient is now just under 7 weeks out from his nondisplaced distal radius fracture.  He is on the cast today.  We will transition to a removable wrist brace for the next several weeks.  He is going sterile speed skating for another month at least.  I can see him back in a month with repeat x-rays.  Follow-Up Instructions: No follow-ups on file.   Orders:  Orders Placed This Encounter  Procedures   XR Wrist Complete Left   No orders of the defined types were placed in this encounter.     Procedures: No procedures performed   Clinical Data: No additional findings.   Subjective: Chief Complaint  Patient presents with   Left Wrist - Pain, Follow-up    This is a 26 yo LHD M who presents for follow up of a closed, nondisplaced left distal radius fracture after a fall skateboarding on 12/18.  He has been in a short arm cast since the injury.  He is doing well today.  Happy to have the cast off.  No complaints.    Review of Systems   Objective: Vital Signs: There were no vitals taken for this visit.  Physical Exam  Left Hand Exam   Tenderness  Left hand tenderness location: Minimal TTP at dorsal wrist. No swelling.   Other  Erythema: absent Sensation: normal Pulse: present  Comments:  Full pronation/supination.  Near full wrist flexion and extension but somewhat limited by stiffness.  Able to make full composite fist and fully extend all fingers.      Specialty Comments:  No specialty comments available.  Imaging: No results found.   PMFS  History: Patient Active Problem List   Diagnosis Date Noted   Closed fracture of left distal radius 06/06/2021   Unstable angina (HCC) 02/04/2021   Chest pain 02/04/2021   Past Medical History:  Diagnosis Date   Bipolar depression (HCC)    Thrombosed hemorrhoids     Family History  Problem Relation Age of Onset   Colon cancer Other        grandfather   Pancreatic cancer Other        aunt    Past Surgical History:  Procedure Laterality Date   APPENDECTOMY     Social History   Occupational History   Not on file  Tobacco Use   Smoking status: Never   Smokeless tobacco: Never  Vaping Use   Vaping Use: Never used  Substance and Sexual Activity   Alcohol use: Not Currently   Drug use: Never   Sexual activity: Not on file

## 2021-07-22 ENCOUNTER — Other Ambulatory Visit: Payer: Self-pay

## 2021-07-22 ENCOUNTER — Emergency Department (HOSPITAL_BASED_OUTPATIENT_CLINIC_OR_DEPARTMENT_OTHER)
Admission: EM | Admit: 2021-07-22 | Discharge: 2021-07-22 | Disposition: A | Discharge status: Home / Self Care | Payer: No Typology Code available for payment source | Attending: Emergency Medicine | Admitting: Emergency Medicine

## 2021-07-22 ENCOUNTER — Encounter (HOSPITAL_BASED_OUTPATIENT_CLINIC_OR_DEPARTMENT_OTHER): Payer: Self-pay | Admitting: Emergency Medicine

## 2021-07-22 DIAGNOSIS — H6121 Impacted cerumen, right ear: Secondary | ICD-10-CM | POA: Diagnosis not present

## 2021-07-22 NOTE — ED Provider Notes (Signed)
°  [facility] EMERGENCY DEPT Provider Note   CSN: [id number] Arrival date & time: 07/22/21  1820     History  Chief Complaint  Patient presents with   Ear Fullness    Kevin [name] is a 26 y.o. male.   Ear Fullness Patient presents with reported earwax on his right side.  States he saw the [place] a couple weeks ago and was told there was no wax in there but feels as if it is blocked.  Some difficulty hearing out of the ear.  Prior to my seeing patient nursing had seen patient and irrigated ear with large amount of wax return.  Feeling better now.  Other ear feels fine.  No nausea or vomiting.     Home Medications Prior to Admission medications   Medication Sig Start Date End Date Taking? Authorizing Provider  HYDROcodone-acetaminophen (NORCO/VICODIN) 5-325 MG tablet Take 1 tablet by mouth every 6 (six) hours as needed for severe pain. 06/04/21   Pollyann Savoy, MD  LAMICTAL 25 MG tablet Take 75 mg by mouth at bedtime.    [provider]  naproxen (NAPROSYN) 375 MG tablet Take 1 tablet twice daily for chest wall pain (costochondritis). 02/09/21   Molpus, Jonny Ruiz, MD      Allergies    Patient has no known allergies.    Review of Systems   Review of Systems  Constitutional:  Negative for fever.  HENT:  Positive for ear pain. Negative for ear discharge.   Respiratory:  Negative for choking.    Physical Exam Updated Vital Signs BP 109/76 (BP Location: Right Arm)    Pulse 60    Temp 98.2 F (36.8 C) (Oral)    Resp 18    Ht 5\' 9"  (1.753 m)    Wt 59 kg    SpO2 100%    BMI 19.20 kg/m  Physical Exam Vitals reviewed.  HENT:     Right Ear: Tympanic membrane normal.     Left Ear: Tympanic membrane normal.  Musculoskeletal:     Cervical back: Neck supple.  Neurological:     Mental Status: He is alert.    ED Results / Procedures / Treatments   Labs (all labs ordered are listed, but only abnormal results are displayed) Labs Reviewed - No data to  display  EKG None  Radiology XR Wrist Complete Left  Result Date: 07/21/2021 3V of the left wrist taken today out of the cast are reviewed and interpreted by me.  They demonstrate no change in fracture alignment with interval fracture healing.    Procedures Procedures    Medications Ordered in ED Medications - No data to display  ED Course/ Medical Decision Making/ A&P                           Medical Decision Making  Patient with apparent cerumen impaction on the right side.  Irrigated by nursing prior to my seeing patient and had resolution of symptoms.  Feeling much better.  Does not appear to need further work-up this time.  Discharge home. Initial differential diagnosis for difficulty hearing includes infection, ear obstruction.        Final Clinical Impression(s) / ED Diagnoses Final diagnoses:  Impacted cerumen of right ear    Rx / DC Orders ED Discharge Orders     None         09/18/2021, MD 07/22/21 2041

## 2021-07-22 NOTE — ED Triage Notes (Signed)
Pt reports right ear wax fullness. Decreased hearing in right ear.

## 2021-08-18 ENCOUNTER — Ambulatory Visit: Payer: No Typology Code available for payment source | Admitting: Orthopedic Surgery

## 2021-08-18 DIAGNOSIS — S52552A Other extraarticular fracture of lower end of left radius, initial encounter for closed fracture: Secondary | ICD-10-CM

## 2021-11-12 ENCOUNTER — Encounter (HOSPITAL_BASED_OUTPATIENT_CLINIC_OR_DEPARTMENT_OTHER): Payer: Self-pay

## 2021-11-12 ENCOUNTER — Emergency Department (HOSPITAL_BASED_OUTPATIENT_CLINIC_OR_DEPARTMENT_OTHER)
Admission: EM | Admit: 2021-11-12 | Discharge: 2021-11-13 | Disposition: A | Discharge status: Other Acute Inpatient Hospital | Payer: No Typology Code available for payment source | Attending: Emergency Medicine | Admitting: Emergency Medicine

## 2021-11-12 ENCOUNTER — Other Ambulatory Visit: Payer: Self-pay

## 2021-11-12 DIAGNOSIS — T426X2A Poisoning by other antiepileptic and sedative-hypnotic drugs, intentional self-harm, initial encounter: Secondary | ICD-10-CM | POA: Insufficient documentation

## 2021-11-12 DIAGNOSIS — T50902A Poisoning by unspecified drugs, medicaments and biological substances, intentional self-harm, initial encounter: Secondary | ICD-10-CM

## 2021-11-12 DIAGNOSIS — F3130 Bipolar disorder, current episode depressed, mild or moderate severity, unspecified: Secondary | ICD-10-CM | POA: Insufficient documentation

## 2021-11-12 DIAGNOSIS — Z79899 Other long term (current) drug therapy: Secondary | ICD-10-CM | POA: Diagnosis not present

## 2021-11-12 DIAGNOSIS — Z043 Encounter for examination and observation following other accident: Secondary | ICD-10-CM | POA: Diagnosis present

## 2021-11-12 DIAGNOSIS — Y900 Blood alcohol level of less than 20 mg/100 ml: Secondary | ICD-10-CM | POA: Insufficient documentation

## 2021-11-12 DIAGNOSIS — Z20822 Contact with and (suspected) exposure to covid-19: Secondary | ICD-10-CM | POA: Diagnosis not present

## 2021-11-12 LAB — COMPREHENSIVE METABOLIC PANEL
ALT: 11 U/L (ref 0–44)
AST: 22 U/L (ref 15–41)
Albumin: 5.1 g/dL — ABNORMAL HIGH (ref 3.5–5.0)
Alkaline Phosphatase: 36 U/L — ABNORMAL LOW (ref 38–126)
Anion gap: 13 (ref 5–15)
BUN: 22 mg/dL — ABNORMAL HIGH (ref 6–20)
CO2: 26 mmol/L (ref 22–32)
Calcium: 9.9 mg/dL (ref 8.9–10.3)
Chloride: 101 mmol/L (ref 98–111)
Creatinine, Ser: 0.96 mg/dL (ref 0.61–1.24)
GFR, Estimated: >60 mL/min (ref >60)
Glucose, Bld: 88 mg/dL (ref 70–99)
Potassium: 3.8 mmol/L (ref 3.5–5.1)
Sodium: 140 mmol/L (ref 135–145)
Total Bilirubin: 1 mg/dL (ref 0.3–1.2)
Total Protein: 7.9 g/dL (ref 6.5–8.1)

## 2021-11-12 LAB — CBC
HCT: 43.1 % (ref 39.0–52.0)
Hemoglobin: 14.3 g/dL (ref 13.0–17.0)
MCH: 28.2 pg (ref 26.0–34.0)
MCHC: 33.2 g/dL (ref 30.0–36.0)
MCV: 85 fL (ref 80.0–100.0)
Platelets: 240 10*3/uL (ref 150–400)
RBC: 5.07 MIL/uL (ref 4.22–5.81)
RDW: 12.5 % (ref 11.5–15.5)
WBC: 6.8 10*3/uL (ref 4.0–10.5)
nRBC: 0 % (ref 0.0–0.2)

## 2021-11-12 LAB — ACETAMINOPHEN LEVEL: Acetaminophen (Tylenol), Serum: <10 ug/mL — ABNORMAL LOW (ref 10–30)

## 2021-11-12 LAB — ETHANOL: Alcohol, Ethyl (B): <10 mg/dL (ref <10)

## 2021-11-12 LAB — CBG MONITORING, ED: Glucose-Capillary: 80 mg/dL (ref 70–99)

## 2021-11-12 LAB — CK: Total CK: 380 U/L (ref 49–397)

## 2021-11-12 LAB — SALICYLATE LEVEL: Salicylate Lvl: <7 mg/dL — ABNORMAL LOW (ref 7.0–30.0)

## 2021-11-12 LAB — MAGNESIUM: Magnesium: 1.8 mg/dL (ref 1.7–2.4)

## 2021-11-12 MED ORDER — CHARCOAL ACTIVATED PO LIQD
59.0000 g | Freq: Once | ORAL | Status: AC
  Administered 2021-11-12: 59 g via ORAL
  Filled 2021-11-12: qty 480

## 2021-11-12 NOTE — ED Triage Notes (Signed)
Pt reports intentional medication overdose. Took 12 tablets (25mg  Lamictal) total of 300mg  at 945PM.

## 2021-11-12 NOTE — ED Notes (Signed)
Pt. Placed in scrubs by NT. Security wanded pt and pt's belongings. NT at bedside sitting with pt.

## 2021-11-12 NOTE — ED Notes (Signed)
Poison Control contacted  Spoke with Danielle, RN   Sts he did not exceed the level of concern.  Cardiac monitor for 6 hours. Possibility for nausea, and drowsiness.  4hr post ingestion tylenol level.  BMP

## 2021-11-12 NOTE — ED Provider Notes (Signed)
[facility] EMERGENCY DEPT Provider Note   CSN: [id number] Arrival date & time: 11/12/21  2206     History  Chief Complaint  Patient presents with   Suicidal   Ingestion    Natalie Leclaire is a 26 y.o. male.   Ingestion   26 year old male with a hx of bipolar depression presenting to the emergency department after a suicide attempt.  The patient states that he intentionally overdosed on his home Lamictal.  He states he took 12 tablets of 25 mg Lamictal for a total of 300 mg at 9:45 PM.  He still has suicidal ideation. Denies HI or AVH.  Home Medications Prior to Admission medications   Medication Sig Start Date End Date Taking? Authorizing Provider  HYDROcodone-acetaminophen (NORCO/VICODIN) 5-325 MG tablet Take 1 tablet by mouth every 6 (six) hours as needed for severe pain. 06/04/21   Pollyann Savoy, MD  LAMICTAL 25 MG tablet Take 75 mg by mouth at bedtime.    [provider]  naproxen (NAPROSYN) 375 MG tablet Take 1 tablet twice daily for chest wall pain (costochondritis). 02/09/21   Molpus, Jonny Ruiz, MD      Allergies    Patient has no known allergies.    Review of Systems   Review of Systems  All other systems reviewed and are negative.  Physical Exam Updated Vital Signs BP 117/78   Pulse 66   Temp 98.9 F (37.2 C)   Resp 16   Ht 5\' 9"  (1.753 m)   Wt 59 kg   SpO2 100%   BMI 19.20 kg/m  Physical Exam Vitals and nursing note reviewed.  Constitutional:      General: He is not in acute distress.    Appearance: He is well-developed.  HENT:     Head: Normocephalic and atraumatic.  Eyes:     Conjunctiva/sclera: Conjunctivae normal.  Cardiovascular:     Rate and Rhythm: Normal rate and regular rhythm.  Pulmonary:     Effort: Pulmonary effort is normal. No respiratory distress.     Breath sounds: Normal breath sounds.  Abdominal:     Palpations: Abdomen is soft.     Tenderness: There is no abdominal tenderness.  Musculoskeletal:         General: No swelling.     Cervical back: Neck supple.  Skin:    General: Skin is warm and dry.     Capillary Refill: Capillary refill takes less than 2 seconds.  Neurological:     Mental Status: He is alert.  Psychiatric:        Mood and Affect: Mood normal.    ED Results / Procedures / Treatments   Labs (all labs ordered are listed, but only abnormal results are displayed) Labs Reviewed  COMPREHENSIVE METABOLIC PANEL - Abnormal; Notable for the following components:      Result Value   BUN 22 (*)    Albumin 5.1 (*)    Alkaline Phosphatase 36 (*)    All other components within normal limits  SALICYLATE LEVEL - Abnormal; Notable for the following components:   Salicylate Lvl <7.0 (*)    All other components within normal limits  ACETAMINOPHEN LEVEL - Abnormal; Notable for the following components:   Acetaminophen (Tylenol), Serum <10 (*)    All other components within normal limits  ETHANOL  CBC  CK  MAGNESIUM  RAPID URINE DRUG SCREEN, HOSP PERFORMED  ACETAMINOPHEN LEVEL  CBG MONITORING, ED    EKG EKG Interpretation  Date/Time:  Sunday Nov 12 2021 22:27:00 EDT Ventricular Rate:  65 PR Interval:  154 QRS Duration: 86 QT Interval:  368 QTC Calculation: 382 R Axis:   70 Text Interpretation: Normal sinus rhythm Normal ECG When compared with ECG of 09-Feb-2021 00:35, PREVIOUS ECG IS PRESENT Confirmed by Ernie Avena ([id number]) on 11/12/2021 10:31:50 PM  Radiology No results found.  Procedures Procedures    Medications Ordered in ED Medications  charcoal activated (NO SORBITOL) (ACTIDOSE-AQUA) suspension 59 g (59 g Oral Given 11/12/21 2231)    ED Course/ Medical Decision Making/ A&P                           Medical Decision Making Amount and/or Complexity of Data Reviewed Labs: ordered.  Risk OTC drugs.   26 year old male with a hx of bipolar depression presenting to the emergency department after a suicide attempt.  The patient states that he  intentionally overdosed on his home Lamictal.  He states he took 12 tablets of 25 mg Lamictal for a total of 300 mg at 9:45 PM.  He still has suicidal ideation. Denies HI or AVH. He endorses nausea and epigastric discomfort.  10:26PM Spoke with [place] poison control. Recommended 6 hrs obs until clearance, EKG, 4-hour Tylenol level, magnesium, CMP, did recommend activated charcoal 1 g/kg.  Ordered 59 g of activated charcoal for the patient.  If asymptomatic and without EKG changes at the 6-hour timeframe, patient can be medically cleared for psychiatric consultation.  EKG performed, significant for sinus rhythm, ventricular rate 65, no abnormal intervals, normal QRS, normal QTc, no acute ST-T changes.  Laboratory work-up significant for initial Tylenol level negative, CBG 80, salicylate level normal, ethanol level normal, magnesium normal, CK normal, CMP without significant electrolyte abnormality, mildly elevated BUN to 22, CBC without a leukocytosis or anemia, UDS ordered and pending.  The patient is pending a 4-hour acetaminophen level postingestion.  He did subsequently ingest activated charcoal as per [place] poison control recommendations.  [place] Poison control has an active case and will follow up on the patient.  We will plan to continue to observe the patient in the emergency department until medically cleared  Signout given to Dr. Bernette Mayers at 2330.    Final Clinical Impression(s) / ED Diagnoses Final diagnoses:  Intentional overdose, initial encounter [facility])    Rx / DC Orders ED Discharge Orders     None         Ernie Avena, MD 11/12/21 2310

## 2021-11-13 ENCOUNTER — Encounter (HOSPITAL_COMMUNITY): Payer: Self-pay | Admitting: Psychiatry

## 2021-11-13 ENCOUNTER — Inpatient Hospital Stay (HOSPITAL_COMMUNITY)
Admission: AD | Admit: 2021-11-13 | Discharge: 2021-11-16 | DRG: 885 | Disposition: A | Discharge status: Home / Self Care | Payer: No Typology Code available for payment source | Source: Intra-hospital | Attending: Psychiatry | Admitting: Psychiatry

## 2021-11-13 DIAGNOSIS — F4310 Post-traumatic stress disorder, unspecified: Secondary | ICD-10-CM | POA: Diagnosis not present

## 2021-11-13 DIAGNOSIS — R079 Chest pain, unspecified: Principal | ICD-10-CM

## 2021-11-13 DIAGNOSIS — F411 Generalized anxiety disorder: Secondary | ICD-10-CM | POA: Diagnosis not present

## 2021-11-13 DIAGNOSIS — F314 Bipolar disorder, current episode depressed, severe, without psychotic features: Principal | ICD-10-CM | POA: Diagnosis present

## 2021-11-13 DIAGNOSIS — F332 Major depressive disorder, recurrent severe without psychotic features: Secondary | ICD-10-CM | POA: Diagnosis not present

## 2021-11-13 DIAGNOSIS — Z9152 Personal history of nonsuicidal self-harm: Secondary | ICD-10-CM

## 2021-11-13 DIAGNOSIS — F3130 Bipolar disorder, current episode depressed, mild or moderate severity, unspecified: Secondary | ICD-10-CM | POA: Diagnosis not present

## 2021-11-13 DIAGNOSIS — G4700 Insomnia, unspecified: Secondary | ICD-10-CM | POA: Diagnosis present

## 2021-11-13 DIAGNOSIS — F329 Major depressive disorder, single episode, unspecified: Secondary | ICD-10-CM | POA: Diagnosis present

## 2021-11-13 LAB — RESP PANEL BY RT-PCR (FLU A&B, COVID) ARPGX2
Influenza A by PCR: NEGATIVE
Influenza B by PCR: NEGATIVE
SARS Coronavirus 2 by RT PCR: NEGATIVE

## 2021-11-13 LAB — ACETAMINOPHEN LEVEL: Acetaminophen (Tylenol), Serum: <10 ug/mL — ABNORMAL LOW (ref 10–30)

## 2021-11-13 LAB — RAPID URINE DRUG SCREEN, HOSP PERFORMED
Amphetamines: NOT DETECTED
Barbiturates: NOT DETECTED
Benzodiazepines: NOT DETECTED
Cocaine: NOT DETECTED
Opiates: NOT DETECTED
Tetrahydrocannabinol: NOT DETECTED

## 2021-11-13 MED ORDER — TRAZODONE HCL 50 MG PO TABS
50.0000 mg | ORAL_TABLET | Freq: Every evening | ORAL | Status: DC | PRN
  Filled 2021-11-13: qty 1

## 2021-11-13 MED ORDER — HYDROXYZINE HCL 25 MG PO TABS
25.0000 mg | ORAL_TABLET | Freq: Three times a day (TID) | ORAL | Status: DC | PRN
  Filled 2021-11-13: qty 1

## 2021-11-13 MED ORDER — MAGNESIUM HYDROXIDE 400 MG/5ML PO SUSP: 30.0000 mL | Freq: Every day | ORAL | Status: DC | PRN

## 2021-11-13 MED ORDER — ACETAMINOPHEN 325 MG PO TABS: 650.0000 mg | ORAL_TABLET | Freq: Four times a day (QID) | ORAL | Status: DC | PRN

## 2021-11-13 MED ORDER — ACETAMINOPHEN 325 MG PO TABS
650.0000 mg | ORAL_TABLET | Freq: Once | ORAL | Status: AC
Start: 2021-11-13 — End: 2021-11-13
  Administered 2021-11-13: 650 mg via ORAL
  Filled 2021-11-13: qty 2

## 2021-11-13 MED ORDER — ALUM & MAG HYDROXIDE-SIMETH 200-200-20 MG/5ML PO SUSP: 30.0000 mL | ORAL | Status: DC | PRN

## 2021-11-13 MED ORDER — LAMOTRIGINE 25 MG PO TABS: 75.0000 mg | ORAL_TABLET | Freq: Every day | ORAL | Status: DC

## 2021-11-13 NOTE — ED Notes (Signed)
TTS in progress 

## 2021-11-13 NOTE — Group Note (Signed)
[facility] LCSW Group Therapy Note   Group Date: 11/13/2021 Start Time: 1300 End Time: 1330   Type of Therapy/Topic:  Group Therapy:  Emotion Regulation  Participation Level:  Minimal   Mood: Euthymic   Description of Group:    The purpose of this group is to assist patients in learning to regulate negative emotions and experience positive emotions. Patients will be guided to discuss ways in which they have been vulnerable to their negative emotions. These vulnerabilities will be juxtaposed with experiences of positive emotions or situations, and patients challenged to use positive emotions to combat negative ones. Special emphasis will be placed on coping with negative emotions in conflict situations, and patients will process healthy conflict resolution skills.  Therapeutic Goals: Patient will identify two positive emotions or experiences to reflect on in order to balance out negative emotions:  Patient will label two or more emotions that they find the most difficult to experience:  Patient will be able to demonstrate positive conflict resolution skills through discussion or role plays:   Summary of Patient Progress: Patient presented late for group. Patient participated in opening and closing remarks. However, patient did not contribute at all to the topic of discussion despite encouraged participation.     Therapeutic Modalities:   Cognitive Behavioral Therapy Feelings Identification Dialectical Behavioral Therapy   Corky Crafts, [place]

## 2021-11-13 NOTE — ED Notes (Signed)
Patient offered breakfast, grits, oatmeal, something to drink.  Patient declined any food or beverage at this time.  Asked for bed to be lowered and lights dimmed.  Patient resting.NAD

## 2021-11-13 NOTE — ED Notes (Signed)
Pt transported to [facility] via [facility] at this time
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
## 2021-11-13 NOTE — ED Provider Notes (Signed)
Pt accepted to [facility] Jim accepting.  Pt remains voluntary for admission.   Terald Sleeper, MD 11/13/21 1143

## 2021-11-13 NOTE — Tx Team (Signed)
Initial Treatment Plan 11/13/2021 2:41 PM [name] [id number]    PATIENT STRESSORS: Educational concerns   Financial difficulties   Marital or family conflict   Traumatic event     PATIENT STRENGTHS: Ability for insight  Capable of independent living  Personnel officer means  Religious Affiliation  Supportive family/friends    PATIENT IDENTIFIED PROBLEMS: Alterations in mood ( Anxiety & Depression) "I've been depressed, sad since my dog was put to sleep".    Poor sleep "I've been sleeping 3-4 hours for weeks now".    Financial constraints "Limited income".    Medication noncompliance (Intentional overdose on home meds).         DISCHARGE CRITERIA:  Improved stabilization in mood, thinking, and/or behavior Verbal commitment to aftercare and medication compliance  PRELIMINARY DISCHARGE PLAN: Outpatient therapy Return to previous living arrangement Return to previous work or school arrangements  PATIENT/FAMILY INVOLVEMENT: This treatment plan has been presented to and reviewed with the patient, Kevin [name]. The patient have been given the opportunity to ask questions and make suggestions.  Sherryl Manges, RN 11/13/2021, 2:41 PM

## 2021-11-13 NOTE — ED Notes (Addendum)
Poison control closed case.

## 2021-11-13 NOTE — ED Notes (Signed)
Rise Paganini, RN at bedside talking with patient.

## 2021-11-13 NOTE — ED Provider Notes (Signed)
Care of the patient assumed at the change of shift. Here after intentional overdose. Awaiting 4 hour APAP level and 6 hour cardiac monitoring.  Physical Exam  BP (!) 102/55   Pulse 64   Temp 98.9 F (37.2 C)   Resp 18   Ht 5\' 9"  (1.753 m)   Wt 59 kg   SpO2 97%   BMI 19.20 kg/m   Physical Exam  Procedures  Procedures  ED Course / MDM   Clinical Course as of 11/13/21 0353  Mon Nov 13, 2021  0352 APAP remains neg. Patient monitored for 6 hours post-ingestion with no concerning cardiac abnormalities. He is medically cleared for TTS evaluation.  [CS]    Clinical Course User Index [CS] Nov 15, 2021, MD   Medical Decision Making Problems Addressed: Intentional overdose, initial encounter [facility]): acute illness or injury  Amount and/or Complexity of Data Reviewed Labs: ordered. Decision-making details documented in ED Course.  Risk OTC drugs.          [facility], MD 11/13/21 [phone]

## 2021-11-13 NOTE — Progress Notes (Signed)
Pt signed 72 hr request for discharge on this date at 1600

## 2021-11-13 NOTE — ED Notes (Addendum)
Pt currently unable to provide urine sample. 

## 2021-11-13 NOTE — BH Assessment (Signed)
Per Truett Mainland Ashburn, RN, the pt should be medically cleared around 3:45 AM but check back around 4 AM. Clinician agreed.   Vertell Novak, [place], [facility], [facility] Triage Specialist [phone]

## 2021-11-13 NOTE — [facility] Group Notes (Signed)
[facility] Group Notes- Patients were educated on the difference between positive and negative thinking and how positive reframing can impact mood. The patients were then given a poem to read by the Dalai Lama on the power of thoughts. The patients were then asked to share one negative thought or belief they would like to let go of. Patient shared and participated.  
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
## 2021-11-13 NOTE — Progress Notes (Signed)
Pt was accepted to [facility] 11/13/2021 PENDING: EKG, Negative COVID-19, and updated vital signs. Bed Assignment 305-2  Pt meets inpatient criteria per Roselyn Bering, NP  Attending Physician will be Dr. Phineas Inches  Report can be called to: Adult unit: [phone]  Pt can arrive after Pending items are complete and coordinated by [facility].  Care Team notified: [facility] Rona Ravens, RN, Delman Kitten, RN Shari Heritage, RN, Hillery Jacks, NP.  Kelton Pillar, LCSWA 11/13/2021 @ 11:07 AM

## 2021-11-13 NOTE — BH Assessment (Signed)
Clinician messaged Megan D. Ashburn, RN: "Hey it's Trey with TTS, what time will be the pt be medically cleared? I see in the EDP note Poison Control recommended 6 hour observation, and he took the medications around 9:45PM."   Clinician awaiting response.    Redmond Pulling, MS, [facility] Triage Specialist [phone]

## 2021-11-13 NOTE — ED Notes (Signed)
Voluntary consent form signed by pt, witnessed by this RN and Steward Drone, EMT. Form faxed to [facility].

## 2021-11-13 NOTE — Progress Notes (Signed)
Psychoeducational Group Note  Date:  11/13/2021 Time:  2145  Group Topic/Focus:  Wrap-Up Group:   The focus of this group is to help patients review their daily goal of treatment and discuss progress on daily workbooks.  Participation Level: Did Not Attend  Participation Quality:  Not Applicable  Affect:  Not Applicable  Cognitive:  Not Applicable  Insight:  Not Applicable  Engagement in Group: Not Applicable Additional Comments:  The patient did not attend group this evening.   Hazle Coca S 11/13/2021, 9:46 PM

## 2021-11-13 NOTE — ED Notes (Signed)
Report given to [facility], consent to transfer signed

## 2021-11-14 DIAGNOSIS — F4310 Post-traumatic stress disorder, unspecified: Secondary | ICD-10-CM

## 2021-11-14 DIAGNOSIS — F314 Bipolar disorder, current episode depressed, severe, without psychotic features: Principal | ICD-10-CM

## 2021-11-14 DIAGNOSIS — G4700 Insomnia, unspecified: Secondary | ICD-10-CM

## 2021-11-14 DIAGNOSIS — F332 Major depressive disorder, recurrent severe without psychotic features: Secondary | ICD-10-CM

## 2021-11-14 DIAGNOSIS — F411 Generalized anxiety disorder: Secondary | ICD-10-CM

## 2021-11-14 LAB — VITAMIN B12: Vitamin B-12: 156 pg/mL — ABNORMAL LOW (ref 180–914)

## 2021-11-14 LAB — LIPID PANEL
Cholesterol: 171 mg/dL (ref 0–200)
HDL: 63 mg/dL (ref >40)
LDL Cholesterol: 100 mg/dL — ABNORMAL HIGH (ref 0–99)
Total CHOL/HDL Ratio: 2.7 RATIO
Triglycerides: 42 mg/dL (ref <150)
VLDL: 8 mg/dL (ref 0–40)

## 2021-11-14 LAB — TSH: TSH: 2.382 u[IU]/mL (ref 0.350–4.500)

## 2021-11-14 LAB — HEMOGLOBIN A1C
Hgb A1c MFr Bld: 5.4 % (ref 4.8–5.6)
Mean Plasma Glucose: 108.28 mg/dL

## 2021-11-14 LAB — VITAMIN D 25 HYDROXY (VIT D DEFICIENCY, FRACTURES): Vit D, 25-Hydroxy: 22.89 ng/mL — ABNORMAL LOW (ref 30–100)

## 2021-11-14 MED ORDER — ARIPIPRAZOLE 5 MG PO TABS
5.0000 mg | ORAL_TABLET | Freq: Every day | ORAL | Status: DC
  Administered 2021-11-14: 5 mg via ORAL
  Filled 2021-11-14 (×3): qty 1
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
## 2021-11-14 NOTE — BHH Counselor (Signed)
Adult Comprehensive Assessment  Patient ID: Kevin [name], male   DOB: [birth year], 26 y.o.   MRN: [id number]  Information Source: Information source: Patient  Current Stressors:  Patient states their primary concerns and needs for treatment are:: States he "put my dog to sleep (2021/11/27) . . . thought I wanted to kill myself." Patient states their goals for this hospitilization and ongoing recovery are:: States he would like to "take a day to calm down" Educational / Learning stressors: none reported Employment / Job issues: none reported Family Relationships: States he does not talk with his family. Financial / Lack of resources (include bankruptcy): non reported Housing / Lack of housing: none reported Physical health (include injuries & life threatening diseases): none reported Social relationships: none reported Substance abuse: none reported Bereavement / Loss: Death of service animal on 27-Nov-2021  Living/Environment/Situation:  Living Arrangements: Non-relatives/Friends Living conditions (as described by patient or guardian): States living conditions are WNL. Who else lives in the home?: Patient lives with room mate How long has patient lived in current situation?: Dec 2022 What is atmosphere in current home: Comfortable, Supportive  Family History:  Marital status: Single Are you sexually active?: No What is your sexual orientation?: Heterosexual Has your sexual activity been affected by drugs, alcohol, medication, or emotional stress?: none reported Does patient have children?: No  Childhood History:  By whom was/is the patient raised?: Other (Comment) (Aunt) Description of patient's relationship with caregiver when they were a child: Describes his relationship with his aunt as a child as strained Patient's description of current relationship with people who raised him/her: Describes his current relationship as strained How were you disciplined when you got in trouble as  a child/adolescent?: Reports being physically reprimanded WNL Does patient have siblings?: No Did patient suffer any verbal/emotional/physical/sexual abuse as a child?: Yes (Patient unable/unwilling to provide details.) Did patient suffer from severe childhood neglect?: No Has patient ever been sexually abused/assaulted/raped as an adolescent or adult?: No Was the patient ever a victim of a crime or a disaster?: No Witnessed domestic violence?: No Has patient been affected by domestic violence as an adult?: No  Education:  Highest grade of school patient has completed: HS Diploma, Some College Currently a student?: Yes Name of school: [employer] How long has the patient attended?: Patient is currently a sophmore. Learning disability?: No  Employment/Work Situation:   Employment Situation: Surveyor, minerals Job has Been Impacted by Current Illness: No Has Patient ever Been in the [employer]?: Yes (Describe in comment) (Patient was in [employer].) Did You Receive Any Psychiatric Treatment/Services While in the Military?: Yes Type of Psychiatric Treatment/Services in Military: Pt states he attempted to kill himself by cutting himself in 2018 while he was in [employer]; he states he was then hospitalized at [facility] at [place], [place].  Financial Resources:   Architect:  ([facility] Disability and GI Immunologist) Does patient have a Lawyer or guardian?: No  Alcohol/Substance Abuse:   What has been your use of drugs/alcohol within the last 12 months?: Patient denies, UDS and BAC congruent w/ report If attempted suicide, did drugs/alcohol play a role in this?: No Alcohol/Substance Abuse Treatment Hx: Denies past history Has alcohol/substance abuse ever caused legal problems?: No  Social Support System:   Patient's Community Support System: Good (Reports his friends are supportive of his mental health.) Type of faith/religion: Christian  Leisure/Recreation:    Do You Have Hobbies?: Yes 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  Strengths/Needs:   Patient states these barriers may affect/interfere with their treatment: none reported Patient states these barriers may affect their return to the community: none reported Other important information patient would like considered in planning for their treatment: none reported  Discharge Plan:   Currently receiving community mental health services: Yes (From Whom) ([facility]) Does patient have access to transportation?: Yes Does patient have financial barriers related to discharge medications?: No Will patient be returning to same living situation after discharge?: Yes  Summary/Recommendations:   Summary and Recommendations (to be completed by the evaluator): 26 y/o male w/ dx of MDD recurrent severe from [place] w/ [place] health benefits admitted following intentional overdose. Patient states he was forced to euthanize his service animal on 11/12/2021 which led to worsening depression. Later his room mate made some comment placing blame on him for the dog dying. Patient then took approximately 20 Lamictal with intention of killing himself. States that he immediately regretted it and drove to the emergency room. Patient is a [place] [employer] veteran medically separated due to psychiatric illness. Receives mental health services with [facility] outpatient clinic. Therapeutic recommendations include further crisis stabilization, medication management, group therapy, and case management.  Corky Crafts. 11/14/2021

## 2021-11-14 NOTE — BHH Group Notes (Signed)
Adult Psychoeducational Group Note  Date:  11/14/2021 Time:  10:09 PM  Group Topic/Focus:  Wrap-Up Group:   The focus of this group is to help patients review their daily goal of treatment and discuss progress on daily workbooks.  Participation Level:  Active  Participation Quality:  Appropriate  Affect:  Appropriate  Cognitive:  Appropriate  Insight: Appropriate  Engagement in Group:  Engaged  Modes of Intervention:  Discussion  Additional Comments:  Patient attended and participated in the Wrap-Up group.  Jearl Klinefelter 11/14/2021, 10:09 PM

## 2021-11-14 NOTE — Progress Notes (Signed)
PATIENT  WITHDREW  HIS  REQUEST  FOR DISCHARGE  FROM [facility] INPATIENT UNIT.  SIGNED  11/14/2021   AT   1433

## 2021-11-14 NOTE — Plan of Care (Addendum)
Nurse discussed anxiety, depression with patient.  

## 2021-11-14 NOTE — Progress Notes (Signed)
BHH/BMU LCSW Progress Note   11/14/2021    8:26 PM  Kevin [name]   [id number]   Type of Contact and Topic:  [facility]   CSW contacted VA 72 hour notification line in order to minimize financial impact on [place]. Patient had existing reauthorization, confirmation number [id number]. CSW will inform Veteran; No further action.     Signed:  Corky Crafts, MSW, LCSWA, LCAS 11/14/2021 8:26 PM

## 2021-11-14 NOTE — Progress Notes (Signed)
   11/14/21 0006  Psych Admission Type (Psych Patients Only)  Admission Status Voluntary  Psychosocial Assessment  Patient Complaints None  Eye Contact Fair  Facial Expression Flat  Affect Appropriate to circumstance  Speech Logical/coherent  Interaction Assertive  Motor Activity Other (Comment)  Appearance/Hygiene Unremarkable  Behavior Characteristics Cooperative  Mood Anxious  Thought Process  Coherency WDL  Content WDL  Delusions None reported or observed  Perception WDL  Hallucination None reported or observed  Judgment Impaired  Confusion None  Danger to Self  Current suicidal ideation? Denies  Danger to Others  Danger to Others None reported or observed   D: Patient in dayroom reports he is doing well. Pt denies any needs.  A: Support and encouragement provided as needed.  R: Patient remains safe on the unit. Will continue to monitor for safety and stability.

## 2021-11-14 NOTE — Progress Notes (Signed)
Patient's self inventory form.  Patient has good sleep, no sleep medication.  Good appetite, normal energy level, good concentration.  Denied depression, hopeless, and anxiety.  Denied withdrawals.  Denied SI.  Denied physical problems.  Denied pain.  Goal is    plans to discharge and go to the [place].  Plan to get plans discharged.  Wants to get plan finalized.  Does have discharge plans. Medications administered per MD orders.  Emotional support and encouragement given patient. Denied SI and HI, contracts for safety.  Denied AA?and   fd                                                                                                                            a

## 2021-11-14 NOTE — Group Note (Signed)
Recreation Therapy Group Note   Group Topic:Animal Assisted Therapy   Group Date: 11/14/2021 Start Time: 1430 End Time: 1515 Facilitators: Caroll Rancher, LRT,CTRS Location: [address]   AAA/T Program Assumption of Risk Form signed by Patient/ or Parent Legal Guardian Yes  Patient is free of allergies or severe asthma Yes  Patient reports no fear of animals Yes  Patient reports no history of cruelty to animals Yes  Patient understands his/her participation is voluntary Yes  Patient washes hands before animal contact Yes  Patient washes hands after animal contact Yes   Affect/Mood: Appropriate   Participation Level: Engaged    Clinical Observations/Individualized Feedback:   Patient attended session and interacted appropriately with therapy dog and peers. Patient asked appropriate questions about therapy dog and his training. Patient shared stories about their pets at home with group.    Plan: Continue to engage patient in RT group sessions 2-3x/week.   Caroll Rancher, Antonietta Jewel 11/14/2021 3:32 PM

## 2021-11-14 NOTE — H&P (Signed)
Psychiatric Admission Assessment Adult  Patient Identification: Kevin [name] MRN:  [id number] Date of Evaluation:  11/14/2021 Chief Complaint:  MDD (major depressive disorder) [F32.9] Principal Diagnosis: MDD (major depressive disorder), recurrent severe, without psychosis ([place]) Diagnosis:  Principal Problem:   MDD (major depressive disorder), recurrent severe, without psychosis ([place]) Active Problems:   MDD (major depressive disorder)   PTSD (post-traumatic stress disorder)   GAD (generalized anxiety disorder)   Insomnia  History of Present Illness: Kevin [name] is a 26 y.o male with a history of bipolar d/o, MDD, GAD & PTSD who presented to the [place] after overdosing on 12 tablets of Lamictal (25 mg each) after his dog died. 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 For this admission, pt was deemed to be a danger to self, and voluntarily transferred to this [facility] for treatment and stabilization of his mood.  During encounter with pt, reports that his dog an "back to back seizures for for 30 minutes, and had 19 seizures". He reports [facility] where he was told that the dog will require a feeding tube and will be incontinent requiring incontinent briefs if he is saved. In addition to that, he was told that the hospital bill will be thousands of dollars. He reports that he decided that it was best to "put the dog down", and after he did, he returned home where he overheard his room mate crying and stating to his ex lover over the phone that it was pt's fault that the dog died. Pt reports that at that moment, all he wanted to do was to be with his dog, and decided to take the overdose of Lamictal "so that I can be with my dog". Pt reports that this was an impulsive act, he regretted it,  and drove himself to the [facility] for help. Pt admits to feeling depressed after the dog died, admits to having a history of depression, but continues to state that he [facility], has his providers there, and prefers to be discharged so that he can go to his Psychiatrist. Pt educated that based on his recent overdose, and the fact that he has a past history of a suicide attempt, it would be necessary for him to remain inpatient so that his mood can be treated and stabilized prior to discharge. Pt continuously downplays and minimizes his symptoms, and seems to have poor insight into his recent overdose and the fact that an overdose on Lamictal has a high risk of fatality.  Pt refuses to give information regarding past mental health history & past traumas, and states that the [place] has this information, and he does not feel comfortable releasing it here since he does not want care from this [facility]. He however admits his mental health history as noted above, denies a history of substance/ETOG/tobacco use, and states that he sees a Teacher, music from the [place] located in [place], but states he does not remember name. 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   Attending Psychiatrist met with pt, and he retracted a 72 hr notice for discharge that he had signed. Writer met with him also,  and he is agreeable to taking Abilify 5 mg daily for management of his depressive symptoms. He has been educated that Lamictal is currently not an option to him due to the fact that he overdosed on in, and it is risky and can lead to fatality if he overdoses on it in the future. Labs reviewed: Hemoglobin A1C, lipid panel, Vitamin B12 levels ordered. Pt was provided with material on loss and grief and a consult will be placed for  him to meet and talk with someone from pastoral care.  Associated Signs/Symptoms: Depression Symptoms:  depressed mood, Duration of Depression Symptoms: Less than two weeks  (Hypo) Manic Symptoms:   n/a Anxiety Symptoms:  Excessive Worry, Psychotic Symptoms:   n/a PTSD Symptoms: Had a traumatic exposure:  PTSD from the [place], but refused to disclose information related to this Total Time spent with patient: 1.5 hours  Past Psychiatric History: MDD, PTSD, bipolar d/o  Is the patient at risk to self? Yes.    Has the patient been a risk to self in the past 6 months? Yes.    Has the patient been a risk to self within the distant past? Yes.    Is the patient a risk to others? No.  Has the patient been a risk to others in the past 6 months? No.  Has the patient been a risk to others within the distant past? No.   Prior Inpatient Therapy:   Prior Outpatient Therapy:    Alcohol Screening: 1. How often do you have a drink containing alcohol?: Never 2. How many drinks containing alcohol do you have on a typical day when you are drinking?: 1 or 2 3. How often do you have six or more drinks on one occasion?: Never AUDIT-C Score: 0 4. How often during the last year have you found that you were not able to stop drinking once you had started?: Never 5. How often during the last year have you failed to do what was normally expected from you because of drinking?: Never 6. How often during the last year have you needed a first drink in the morning to get yourself going after a heavy drinking session?: Never 7. How often during the last year have you had a feeling of guilt of remorse after drinking?: Never 8. How often during the last year have you been unable to remember what happened the night before because you had been drinking?: Never 9. Have you or someone else been injured as a result of your drinking?: No 10. Has a relative or friend or a doctor or another health worker been concerned  about your drinking or suggested you cut down?: No Alcohol Use Disorder Identification Test Final Score (AUDIT): 0 Substance Abuse History in the last 12 months:  No. Consequences of Substance Abuse: NA Previous Psychotropic Medications: Yes  Psychological Evaluations: No  Past Medical History:  Past Medical History:  Diagnosis Date   Bipolar depression ([place])    Thrombosed hemorrhoids     Past Surgical History:  Procedure Laterality Date   APPENDECTOMY     Family History:  Family History  Problem Relation Age of Onset   Colon cancer Other        grandfather   Pancreatic cancer Other        aunt   Family Psychiatric  History: none reported Tobacco Screening:   Social History:  Social History   Substance and Sexual Activity  Alcohol Use Not Currently     Social History  Substance and Sexual Activity  Drug Use Never    Additional Social History: Marital status: Single Are you sexually active?: No What is your sexual orientation?: Heterosexual Has your sexual activity been affected by drugs, alcohol, medication, or emotional stress?: none reported Does patient have children?: No    Allergies:  No Known Allergies Lab Results:  Results for orders placed or performed during the hospital encounter of 11/13/21 (from the past 48 hour(s))  TSH     Status: None   Collection Time: 11/14/21  6:26 AM  Result Value Ref Range   TSH 2.382 0.350 - 4.500 uIU/mL    Comment: Performed by a 3rd Generation assay with a functional sensitivity of <=0.01 uIU/mL. Performed at [facility], [place] [address] [place], [place] [address]    Blood Alcohol level:  Lab Results  Component Value Date   ETH <10 01/28/8870    Metabolic Disorder Labs:  No results found for: HGBA1C, MPG No results found for: PROLACTIN No results found for: CHOL, TRIG, HDL, CHOLHDL, VLDL, LDLCALC  Current Medications: Current Facility-Administered Medications  Medication Dose Route  Frequency Provider Last Rate Last Admin   acetaminophen (TYLENOL) tablet 650 mg  650 mg Oral Q6H PRN Derrill Center, NP       alum & mag hydroxide-simeth (MAALOX/MYLANTA) 200-200-20 MG/5ML suspension 30 mL  30 mL Oral Q4H PRN Derrill Center, NP       hydrOXYzine (ATARAX) tablet 25 mg  25 mg Oral TID PRN Derrill Center, NP       magnesium hydroxide (MILK OF MAGNESIA) suspension 30 mL  30 mL Oral Daily PRN Derrill Center, NP       traZODone (DESYREL) tablet 50 mg  50 mg Oral QHS PRN Derrill Center, NP       PTA Medications: No medications prior to admission.   Musculoskeletal: Strength & Muscle Tone: within normal limits Gait & Station: normal Patient leans: N/A Psychiatric Specialty Exam:  Presentation  General Appearance: Appropriate for Environment; Fairly Groomed  Eye Contact:Good  Speech:Clear and Coherent  Speech Volume:Normal  Handedness:Right   Mood and Affect  Mood:Euthymic  Affect:Appropriate   Thought Process  Thought Processes:Coherent  Duration of Psychotic Symptoms: No data recorded Past Diagnosis of Schizophrenia or Psychoactive disorder: No  Descriptions of Associations:Intact  Orientation:Full (Time, Place and Person)  Thought Content:Logical  Hallucinations:Hallucinations: None  Ideas of Reference:None  Suicidal Thoughts:Suicidal Thoughts: No  Homicidal Thoughts:Homicidal Thoughts: No   Sensorium  Memory:Immediate Good  Judgment:Poor  Insight:Poor   Executive Functions  Concentration:Fair  Attention Span:Fair  [place]   Psychomotor Activity  Psychomotor Activity:Psychomotor Activity: Normal   Assets  Assets:Communication Skills; Housing; Social Support   Sleep  Sleep:Sleep: Poor    Physical Exam: Physical Exam Constitutional:      Appearance: Normal appearance.  HENT:     Head: Normocephalic.     Nose: Nose normal. No congestion or rhinorrhea.  Eyes:      Pupils: Pupils are equal, round, and reactive to light.  Pulmonary:     Effort: Pulmonary effort is normal.  Musculoskeletal:        General: Normal range of motion.     Cervical back: Normal range of motion.  Neurological:     Mental Status: He is alert and oriented to person, place, and time.     Sensory: No sensory deficit.     Coordination: Coordination normal.  Psychiatric:  Thought Content: Thought content normal.   Review of Systems  Constitutional: Negative.  Negative for fever.  HENT: Negative.    Eyes: Negative.   Respiratory: Negative.  Negative for cough.   Cardiovascular: Negative.  Negative for chest pain.  Gastrointestinal: Negative.  Negative for heartburn.  Genitourinary: Negative.   Musculoskeletal: Negative.   Skin: Negative.   Neurological: Negative.   Endo/Heme/Allergies: Negative.   Psychiatric/Behavioral:  Positive for depression. Negative for hallucinations, memory loss, substance abuse and suicidal ideas. The patient is nervous/anxious and has insomnia.   Blood pressure 116/66, pulse 61, temperature 98.4 F (36.9 C), temperature source Oral, resp. rate 20, height _0  (1.753 m), weight 64.4 kg, SpO2 98 %. Body mass index is 20.97 kg/m.  Treatment Plan Summary: Daily contact with patient to assess and evaluate symptoms and progress in treatment and Medication management  Observation Level/Precautions:  15 minute checks  Laboratory:  Labs reviewed   Psychotherapy:  Unit Group sessions  Medications:  See [facility]  Consultations:  To be determined   Discharge Concerns:  Safety, medication compliance, mood stability  Estimated LOS: 5-7 days  Other:  N/A   PLAN Safety and Monitoring: Voluntary admission to inpatient psychiatric unit for safety, stabilization and treatment Daily contact with patient to assess and evaluate symptoms and progress in treatment Patient's case to be discussed in multi-disciplinary team meeting Observation Level : q15  minute checks Vital signs: q12 hours Precautions: suicide, elopement, and assault  Long Term Goal(s): Improvement in symptoms so as ready for discharge  Short Term Goals: Ability to identify changes in lifestyle to reduce recurrence of condition will improve, Ability to disclose and discuss suicidal ideas, Ability to identify and develop effective coping behaviors will improve, and Compliance with prescribed medications will improve  Diagnoses Principal Problem:   MDD (major depressive disorder), recurrent severe, without psychosis ([place]) Active Problems:   MDD (major depressive disorder)   PTSD (post-traumatic stress disorder)   GAD (generalized anxiety disorder)   Insomnia  Medications -Start Abilify 5 mg nightly for MDD -Continue Hydroxyzine 25 mg PRN Q 6 H for anxiety -Continue Trazodone 50 mg nightly PRN for insomnia  Other PRNS -Continue Tylenol 650 mg every 6 hours PRN for mild pain -Continue Maalox 30 mg every 4 hrs PRN for indigestion -Continue Milk of Magnesia as needed every 6 hrs for constipation  Discharge Planning: Social work and case management to assist with discharge planning and identification of hospital follow-up needs prior to discharge Estimated LOS: 5-7 days Discharge Concerns: Need to establish a safety plan; Medication compliance and effectiveness Discharge Goals: Return home with outpatient referrals for mental health follow-up including medication management/psychotherapy  I certify that inpatient services furnished can reasonably be expected to improve the patient's condition.    Nicholes Rough, NP 5/30/20234:28 PM

## 2021-11-14 NOTE — BHH Suicide Risk Assessment (Signed)
BHH INPATIENT:  Family/Significant Other Suicide Prevention Education  Suicide Prevention Education:  Contact Attempts: Jackolyn Confer, Friend, [phone] has been identified by the patient as the family member/significant other with whom the patient will be residing, and identified as the person(s) who will aid the patient in the event of a mental health crisis.  With written consent from the patient, two attempts were made to provide suicide prevention education, prior to and/or following the patient's discharge.  We were unsuccessful in providing suicide prevention education.  A suicide education pamphlet was given to the patient to share with family/significant other.  Date and time of first attempt: 11/14/2021 @ 1:27 PM  Date and time of second attempt: CSW will make additional attempts to reach friend in order to completed SPE.   Corky Crafts 11/14/2021, 1:26 PM

## 2021-11-15 ENCOUNTER — Inpatient Hospital Stay (HOSPITAL_COMMUNITY): Payer: No Typology Code available for payment source

## 2021-11-15 ENCOUNTER — Other Ambulatory Visit: Payer: Self-pay

## 2021-11-15 ENCOUNTER — Encounter (HOSPITAL_COMMUNITY): Payer: Self-pay

## 2021-11-15 LAB — BASIC METABOLIC PANEL
Anion gap: 5 (ref 5–15)
BUN: 21 mg/dL — ABNORMAL HIGH (ref 6–20)
CO2: 29 mmol/L (ref 22–32)
Calcium: 9.8 mg/dL (ref 8.9–10.3)
Chloride: 105 mmol/L (ref 98–111)
Creatinine, Ser: 1.12 mg/dL (ref 0.61–1.24)
GFR, Estimated: >60 mL/min (ref >60)
Glucose, Bld: 86 mg/dL (ref 70–99)
Potassium: 4.1 mmol/L (ref 3.5–5.1)
Sodium: 139 mmol/L (ref 135–145)

## 2021-11-15 LAB — CBC
HCT: 45.6 % (ref 39.0–52.0)
Hemoglobin: 14.5 g/dL (ref 13.0–17.0)
MCH: 27.9 pg (ref 26.0–34.0)
MCHC: 31.8 g/dL (ref 30.0–36.0)
MCV: 87.9 fL (ref 80.0–100.0)
Platelets: 237 10*3/uL (ref 150–400)
RBC: 5.19 MIL/uL (ref 4.22–5.81)
RDW: 12.2 % (ref 11.5–15.5)
WBC: 5.7 10*3/uL (ref 4.0–10.5)
nRBC: 0 % (ref 0.0–0.2)

## 2021-11-15 LAB — TROPONIN I (HIGH SENSITIVITY)
Troponin I (High Sensitivity): 7 ng/L (ref <18)
Troponin I (High Sensitivity): 7 ng/L (ref <18)

## 2021-11-15 MED ORDER — ONDANSETRON 4 MG PO TBDP
4.0000 mg | ORAL_TABLET | Freq: Once | ORAL | Status: AC
  Administered 2021-11-15: 4 mg via ORAL
  Filled 2021-11-15 (×2): qty 1

## 2021-11-15 MED ORDER — HYDROXYZINE HCL 25 MG PO TABS: 25.0000 mg | ORAL_TABLET | Freq: Four times a day (QID) | ORAL | Status: DC | PRN

## 2021-11-15 MED ORDER — ALUM & MAG HYDROXIDE-SIMETH 200-200-20 MG/5ML PO SUSP: 30.0000 mL | Freq: Four times a day (QID) | ORAL | Status: DC | PRN

## 2021-11-15 MED ORDER — SODIUM CHLORIDE 0.9 % IV BOLUS
1000.0000 mL | Freq: Once | INTRAVENOUS | Status: AC
  Administered 2021-11-15: 1000 mL via INTRAVENOUS

## 2021-11-15 MED ORDER — ACETAMINOPHEN 325 MG PO TABS: 650.0000 mg | ORAL_TABLET | Freq: Four times a day (QID) | ORAL | Status: DC | PRN

## 2021-11-15 MED ORDER — MAGNESIUM HYDROXIDE 400 MG/5ML PO SUSP
30.0000 mL | Freq: Every day | ORAL | Status: DC
  Administered 2021-11-15: 30 mL via ORAL
  Filled 2021-11-15 (×2): qty 30

## 2021-11-15 NOTE — Progress Notes (Signed)
   11/15/21 2200  Psych Admission Type (Psych Patients Only)  Admission Status Voluntary  Psychosocial Assessment  Patient Complaints None  Eye Contact Fair  Facial Expression Anxious  Affect Anxious  Speech Logical/coherent  Interaction Assertive  Motor Activity Fidgety  Appearance/Hygiene Disheveled  Behavior Characteristics Cooperative  Mood Anxious  Aggressive Behavior  Effect No apparent injury  Thought Process  Coherency WDL  Content WDL  Delusions WDL  Perception WDL  Hallucination None reported or observed  Judgment WDL  Confusion WDL  Danger to Self  Current suicidal ideation? Denies  Danger to Others  Danger to Others None reported or observed

## 2021-11-15 NOTE — Progress Notes (Signed)
Chaplain acknowledges Spiritual Care Consult to support patient after a loss.  Chaplain will check on [facility] tomorrow.

## 2021-11-15 NOTE — ED Notes (Signed)
Pt received 324mg  of Asprin via EMS

## 2021-11-15 NOTE — [facility] Group Notes (Signed)
PsychoEducational Group- Patients were educated on mental health dysregulation and identifying signs in which they were not compensating. Patients were given analogy of the brain being like a car, and that regulation requires daily practice. The patients were then asked to share one sign they noticed when they were becoming dysregulated. Pt participated and was appropriate 

## 2021-11-15 NOTE — ED Triage Notes (Addendum)
Pt BIB ems due to chest pain from [facility]. Pt states he was having chest pain that started this morning. NH states there was ST elevation. Pt states he is no longer suicidal. Pt states its mid sternal chest pain. 7/10 pain. Pt has hx of cardiomyopathy

## 2021-11-15 NOTE — [facility] Suicide Risk Assessment (Signed)
[facility] INPATIENT:  Family/Significant Other Suicide Prevention Education   Suicide Prevention Education:  Contact Attempts: Jackolyn Confer, Friend, [phone] has been identified by the patient as the family member/significant other with whom the patient will be residing, and identified as the person(s) who will aid the patient in the event of a mental health crisis.  With written consent from the patient, two attempts were made to provide suicide prevention education, prior to and/or following the patient's discharge.  We were unsuccessful in providing suicide prevention education.  A suicide education pamphlet was given to the patient to share with family/significant other.   Date and time of first attempt: 11/14/2021 @ 1:27 PM  Date and time of second attempt: 11/15/2021 @ 4:10 PM   CSW will make additional attempts to reach friend in order to completed SPE.   Signed:  Corky Crafts, MSW, LCSWA, LCAS 11/15/2021 4:10 PM

## 2021-11-15 NOTE — Progress Notes (Signed)
Patient woke up this morning at 0614 and reported nausea, lightheaded and diaphoresis. Patient's blood pressure was 96/74. Pt was given Gatorade and NP was notified. Pt's BP improved after taking Gatorade lat BP was 111/82. Will continue to monitor and update as needed.

## 2021-11-15 NOTE — ED Provider Notes (Signed)
[facility] EMERGENCY DEPARTMENT Provider Note   CSN: [id number] Arrival date & time: 11/15/21  [id number]     History PMH: Bipolar 1 Disorder - recent suicide attempt (5/29),  - reported history of takosubo cardiomyopathy. Admitted in August 2022. He was found to have EKG changes with troponin elevation. Coronary Ca score 0, Echo revealed borderline LV hypertrophy, normal EF. He did not f/u with cardiology.  Chief Complaint  Patient presents with   Chest Pain    Kevin [name] is a 26 y.o. male.  Presents the ED with a chief complaint of chest pain.  He states that this morning he woke up and he was laying in bed when he had sudden onset diaphoresis and nausea.  He associated chest pain in the center of his chest that felt like someone was punching him.  This did not radiate.  This lasted about 30 seconds and have subsided and now he just has a mild burning sensation in his mid chest.  He states that he the behavioral health facility that he is in record his blood pressure at the time and so that it was low.  It improved a little bit after fluids.  He said they got an EKG and it read STEMI so he was sent to the emergency department.   He did have a recent suicide attempt 2 days ago with Lamictal overdose.  He was discharged to [facility].  He denies any suicidal ideations at this time.   Chest Pain Associated symptoms: diaphoresis and nausea    HPI: A 26 year old patient presents for evaluation of chest pain. Initial onset of pain was approximately 3-6 hours ago. The patient's chest pain is well-localized, is described as heaviness/pressure/tightness and is not worse with exertion. The patient complains of nausea and reports some diaphoresis. The patient's chest pain is middle- or left-sided, is not sharp and does not radiate to the arms/jaw/neck. The patient has no history of stroke, has no history of peripheral artery disease, has not smoked in the past 90  days, denies any history of treated diabetes, has no relevant family history of coronary artery disease (first degree relative at less than age 88), is not hypertensive, has no history of hypercholesterolemia and does not have an elevated BMI (>=30).   Home Medications Prior to Admission medications   Not on File      Allergies    Patient has no known allergies.    Review of Systems   Review of Systems  Constitutional:  Positive for diaphoresis.  Cardiovascular:  Positive for chest pain.  Gastrointestinal:  Positive for nausea.  All other systems reviewed and are negative.  Physical Exam Updated Vital Signs BP 107/73   Pulse (!) 57   Temp 97.9 F (36.6 C) (Oral)   Resp 18   Ht 5\' 9"  (1.753 m)   Wt 64.4 kg   SpO2 100%   BMI 20.97 kg/m  Physical Exam Vitals and nursing note reviewed.  Constitutional:      General: He is not in acute distress.    Appearance: Normal appearance. He is not ill-appearing, toxic-appearing or diaphoretic.  HENT:     Head: Normocephalic and atraumatic.     Nose: No nasal deformity.     Mouth/Throat:     Lips: Pink. No lesions.     Mouth: Mucous membranes are moist. No injury, lacerations, oral lesions or angioedema.     Pharynx: Oropharynx is clear. Uvula midline. No pharyngeal swelling, oropharyngeal  exudate, posterior oropharyngeal erythema or uvula swelling.  Eyes:     General: Gaze aligned appropriately. No scleral icterus.       Right eye: No discharge.        Left eye: No discharge.     Conjunctiva/sclera: Conjunctivae normal.     Right eye: Right conjunctiva is not injected. No exudate or hemorrhage.    Left eye: Left conjunctiva is not injected. No exudate or hemorrhage. Neck:     Vascular: No JVD.     Trachea: No tracheal deviation.  Cardiovascular:     Rate and Rhythm: Normal rate and regular rhythm.     Pulses: Normal pulses.          Radial pulses are 2+ on the right side and 2+ on the left side.       Dorsalis pedis pulses  are 2+ on the right side and 2+ on the left side.     Heart sounds: Normal heart sounds, S1 normal and S2 normal. Heart sounds not distant. No murmur heard.   No friction rub. No gallop. No S3 or S4 sounds.  Pulmonary:     Effort: Pulmonary effort is normal. No accessory muscle usage or respiratory distress.     Breath sounds: Normal breath sounds. No stridor. No wheezing, rhonchi or rales.  Chest:     Chest wall: No tenderness.  Abdominal:     General: Abdomen is flat. Bowel sounds are normal. There is no distension.     Palpations: Abdomen is soft. There is no mass or pulsatile mass.     Tenderness: There is no abdominal tenderness. There is no guarding or rebound.  Musculoskeletal:     Right lower leg: No edema.     Left lower leg: No edema.  Skin:    General: Skin is warm and dry.     Coloration: Skin is not jaundiced or pale.     Findings: No bruising, erythema, lesion or rash.  Neurological:     General: No focal deficit present.     Mental Status: He is alert and oriented to person, place, and time.     GCS: GCS eye subscore is 4. GCS verbal subscore is 5. GCS motor subscore is 6.  Psychiatric:        Mood and Affect: Mood normal.        Behavior: Behavior normal. Behavior is cooperative.    ED Results / Procedures / Treatments   Labs (all labs ordered are listed, but only abnormal results are displayed) Labs Reviewed  LIPID PANEL - Abnormal; Notable for the following components:      Result Value   LDL Cholesterol 100 (*)    All other components within normal limits  VITAMIN D 25 HYDROXY (VIT D DEFICIENCY, FRACTURES) - Abnormal; Notable for the following components:   Vit D, 25-Hydroxy 22.89 (*)    All other components within normal limits  VITAMIN B12 - Abnormal; Notable for the following components:   Vitamin B-12 156 (*)    All other components within normal limits  BASIC METABOLIC PANEL - Abnormal; Notable for the following components:   BUN 21 (*)    All other  components within normal limits  TSH  HEMOGLOBIN A1C  CBC  TROPONIN I (HIGH SENSITIVITY)  TROPONIN I (HIGH SENSITIVITY)    EKG EKG Interpretation  Date/Time:  Wednesday Nov 15 2021 10:05:53 EDT Ventricular Rate:  60 PR Interval:  155 QRS Duration: 91 QT Interval:  409 QTC  Calculation: 409 R Axis:   50 Text Interpretation: Sinus rhythm ST elev, probable normal early repol pattern No sig change from prior tracing Confirmed by Octaviano Glow [phone]) on 11/15/2021 10:39:32 AM  Radiology DG Chest Port 1 View  Result Date: 11/15/2021 CLINICAL DATA:  Sternal chest pain this morning EXAM: PORTABLE CHEST 1 VIEW COMPARISON:  Portable exam 1035 hours compared 02/09/2021 FINDINGS: Normal heart size, mediastinal contours, and pulmonary vascularity. Lungs clear. No pleural effusion or pneumothorax. Bones unremarkable. IMPRESSION: No acute abnormalities. Electronically Signed   By: Lavonia Dana M.D.   On: 11/15/2021 10:47    Procedures Procedures  This patient was on telemetry or cardiac monitoring during their time in the ED.    Medications Ordered in ED Medications  acetaminophen (TYLENOL) tablet 650 mg (has no administration in time range)  magnesium hydroxide (MILK OF MAGNESIA) suspension 30 mL (has no administration in time range)  alum & mag hydroxide-simeth (MAALOX/MYLANTA) 200-200-20 MG/5ML suspension 30 mL (has no administration in time range)  hydrOXYzine (ATARAX) tablet 25 mg (has no administration in time range)  ondansetron (ZOFRAN-ODT) disintegrating tablet 4 mg (4 mg Oral Given 11/15/21 0648)  sodium chloride 0.9 % bolus 1,000 mL (0 mLs Intravenous Stopped 11/15/21 1222)    ED Course/ Medical Decision Making/ A&P   HEAR Score: 2                       Medical Decision Making Amount and/or Complexity of Data Reviewed Labs: ordered. Radiology: ordered.    MDM  This is a 26 y.o. male who presents to the ED with chest pain The differential of this patient includes but is  not limited to ACS/NSTEMI, Aortic Dissection, Esophageal Rupture, PE, PTX, Tamponade, Endocarditis, GERD, MSK, Pericarditis, PNA, and Dermatomal Rash  My Impression, Plan, and ED Course:  Patient presents after having sudden onset chest pain with associated hypotension this morning.  The symptoms have resolved. His vitals here are stable with a normal blood pressure. Reviewed cardiac history and he has had an overall unremarkable work-up.  Patient does verbalize a history of Takotsubo cardiomyopathy, however I do not see this anywhere in cardiology notes from when he was admitted last August.  He did have an echo with mild left ventricular hypertrophy, but no other abnormalities.  EKG from prior facility did show ST elevations, however upon our review, these are similar to prior and no reciprocal changes. History does not seem consistent with pericarditis or endocarditis. I doubt aortic dissection, PE, or Esophageal rupture. Cardiac workup initiated.  He is also presenting from behavioral health facility after a recent intentional overdose.  He denies any suicidal ideations at this time.  He is stable from the standpoint.  I personally ordered, reviewed, and interpreted all laboratory work and imaging and agree with radiologist interpretation. Results interpreted below:  CBC is reassuring or maladies.  BMP also has no abnormalities.  Troponin is normal x 2.  X-ray does not reveal any pneumothorax or other acute abnormalities.  EKG is unchanged from baseline and consistent with early repolarization   Overall, this is a very unremarkable work-up.  I did review patient's prior cardiology notes and think he has a low risk having ACS. His HEART score is 2. Symptoms are resolved. BP has remained stable. I think that he has been reasonably screened and does not require any further workup. He is stable to return back to behavior health facility.  Charting Requirements Additional history is obtained from:  Independent historian External Records from outside source obtained and reviewed including: Reviewed recent ED note from overdose. Reviewed prior labs. Reviewed prior hospitalization for chest pain. Reviewed prior ECHO and CT study results.  Social Determinants of Health:   Mental health concerns Pertinant PMH that complicates patient's illness: hx of prior chest pain  Patient Care Problems that were addressed during this visit: - Nonspecific Chest pain: Acute illness with systemic symptoms This patient was maintained on a cardiac monitor/telemetry. I personally viewed and interpreted the cardiac monitor which reveals an underlying rhythm of NSR Medications given in ED: IVF bolus Reevaluation of the patient after these medicines showed that the patient improved I have reviewed home medications and made no changes Disposition: discharge back to Behavioral health   This is a supervised visit with my attending physician, Dr. Langston Masker. We have discussed this patient and they have altered the plan as needed.  Portions of this note were generated with [employer]. Dictation errors may occur despite best attempts at proofreading.   Final Clinical Impression(s) / ED Diagnoses Final diagnoses:  Nonspecific chest pain    Rx / DC Orders ED Discharge Orders     None         Adolphus Birchwood, PA-C 11/15/21 1606    Wyvonnia Dusky, MD 11/16/21 [phone]

## 2021-11-15 NOTE — Discharge Instructions (Signed)
Your workup today was reassuring. I do not think that there is an emergent cause to your pain. I would recommend following up with your cardiologist outpatient. I have put their contact information in your discharge paperwork.

## 2021-11-15 NOTE — ED Notes (Signed)
Pt ambulatory to bathroom

## 2021-11-15 NOTE — Group Note (Signed)
[facility] LCSW Group Therapy Note  Date/Time: 11/18/14 at 3:00pm  Type of Therapy and Topic:  Group Therapy:  Strengths and Qualities  Participation Level:  None   Description of Group:    In this group patients will be asked to explore and define the terms strength ans qualities.  Patients will be guided to discuss their thoughts, feelings, and behaviors as to where strengths and qualities originate. Participants will then list some of their strengths and qualities related to each subject topic. This group will be process-oriented, with patients participating in exploration of their own experiences as well as giving and receiving support and challenge from other group members.  Therapeutic Goals: Patient will identify specific strengths related to their personal life. Patient will identify feelings, thoughts, and beliefs about strengths and qualities. Patient will identify ways their strengths have been used. . Patient will identify situations where they have helped others or made someone else happy. .  Summary of Patient Progress Patient presented to group roughly 10 minutes prior to end.    Therapeutic Modalities:   Cognitive Behavioral Therapy Solution Focused Therapy Motivational Interviewing Brief Therapy

## 2021-11-15 NOTE — Progress Notes (Signed)
[facility] MD Progress Note  11/15/2021 3:56 PM Kevin [name]  MRN:  [id number]  Reason For Admission: History of Present Illness: Kevin [name] is a 26 y.o male with a history of bipolar d/o, MDD, GAD & PTSD who presented to the [facility] after overdosing on 12 tablets of Lamictal (25 mg each) after his dog died. As per chart review, pt has a history of a suicide attempt via cutting his wrists in 2018 while serving in the [employer], requiring hospitalization at the [facility] center at [place], [place]. For this admission, pt was deemed to be a danger to self, and voluntarily transferred to this [facility] for treatment and stabilization of his mood.  Today's Patient assessment: Pt presents with a euthymic mood & affect is congruent and appropriate. His attention to personal hygiene and grooming is fair, eye contact is good, speech is clear & coherent. Thought contents are organized and logical, and pt currently denies SI/HI/AVH or paranoia. There is no evidence of delusional thoughts.    Pt reports a good sleep quality last night, and as per nursing flow sheets, he slept for a total of 7 hours. He reports a good appetite. Pt was diaphoretic earlier in the morning, complained of chest pain, and changes were noted on his EKG. He was transferred to [facility] to rule out cardiac injury. Pt was medically worked up and cleared, Troponin was WNL and pt was  transferred back to this [facility]. An EKG completed while pt was in the ER showed QTC WNL at 409, & "ST elev, probable normal early repol pattern". Pt is currently denying chest pain, denies lightheadedness, and denies other cardiac related symptoms. He took a dose of Abilify 5 mg last night for stabilization of his mood, and woke up reporting above symptoms. Pt has been educated that Abilify has been discontinued, and we are holding off on Lamictal for now to allow a wash out period since he overdosed on it. Pt has also been educated that  we weill reconsider restarting Lamictal within the next couple of days. Pt reports that he has been reading information handed to him yesterday by Clinical research associate on loss and grief. He is agreeable to an consult being placed with pastoral care to help him process the loss of his dog.  Principal Problem: Severe bipolar I disorder, most recent episode depressed (HCC) Diagnosis: Principal Problem:   Severe bipolar I disorder, most recent episode depressed (HCC) Active Problems:   Insomnia  Total Time spent with patient: 20 minutes  Past Psychiatric History: As above  Past Medical History:  Past Medical History:  Diagnosis Date   Bipolar depression (HCC)    Thrombosed hemorrhoids     Past Surgical History:  Procedure Laterality Date   APPENDECTOMY     Family History:  Family History  Problem Relation Age of Onset   Colon cancer Other        grandfather   Pancreatic cancer Other        aunt   Family Psychiatric  History: none reported Social History:  Social History   Substance and Sexual Activity  Alcohol Use Not Currently     Social History   Substance and Sexual Activity  Drug Use Never    Social History   Socioeconomic History   Marital status: Single    Spouse name: Not on file   Number of children: Not on file   Years of education: Not on file   Highest education level:  Not on file  Occupational History   Not on file  Tobacco Use   Smoking status: Never   Smokeless tobacco: Never  Vaping Use   Vaping Use: Never used  Substance and Sexual Activity   Alcohol use: Not Currently   Drug use: Never   Sexual activity: Not on file  Other Topics Concern   Not on file  Social History Narrative   Not on file   Social Determinants of Health   Financial Resource Strain: Not on file  Food Insecurity: Not on file  Transportation Needs: Not on file  Physical Activity: Not on file  Stress: Not on file  Social Connections: Not on file   Additional Social History:      Sleep: Good  Appetite:  Good  Current Medications: Current Facility-Administered Medications  Medication Dose Route Frequency Provider Last Rate Last Admin   acetaminophen (TYLENOL) tablet 650 mg  650 mg Oral Q6H PRN Traylon Schimming, NP       alum & mag hydroxide-simeth (MAALOX/MYLANTA) 200-200-20 MG/5ML suspension 30 mL  30 mL Oral Q6H PRN Sabri Teal, NP       hydrOXYzine (ATARAX) tablet 25 mg  25 mg Oral Q6H PRN Bohdan Macho, NP       magnesium hydroxide (MILK OF MAGNESIA) suspension 30 mL  30 mL Oral Daily Adelynne Joerger, NP        Lab Results:  Results for orders placed or performed during the hospital encounter of 11/13/21 (from the past 48 hour(s))  TSH     Status: None   Collection Time: 11/14/21  6:26 AM  Result Value Ref Range   TSH 2.382 0.350 - 4.500 uIU/mL    Comment: Performed by a 3rd Generation assay with a functional sensitivity of <=0.01 uIU/mL. Performed at [facility], [address], [place], [place] [address]   Hemoglobin A1c     Status: None   Collection Time: 11/14/21  6:36 PM  Result Value Ref Range   Hgb A1c MFr Bld 5.4 4.8 - 5.6 %    Comment: (NOTE) Pre diabetes:          5.7%-6.4%  Diabetes:              >6.4%  Glycemic control for   <7.0% adults with diabetes    Mean Plasma Glucose 108.28 mg/dL    Comment: Performed at [facility], [address] [place], [place] [address]  Lipid panel     Status: Abnormal   Collection Time: 11/14/21  6:36 PM  Result Value Ref Range   Cholesterol 171 0 - 200 mg/dL   Triglycerides 42 <865 mg/dL   HDL 63 >78 mg/dL   Total CHOL/HDL Ratio 2.7 RATIO   VLDL 8 0 - 40 mg/dL   LDL Cholesterol 469 (H) 0 - 99 mg/dL    Comment:        Total Cholesterol/HDL:CHD Risk Coronary Heart Disease Risk Table                     Men   Women  1/2 Average Risk   3.4   3.3  Average Risk       5.0   4.4  2 X Average Risk   9.6   7.1  3 X Average Risk  23.4   11.0        Use the calculated  Patient Ratio above and the CHD Risk Table to determine the patient's CHD Risk.  ATP III CLASSIFICATION (LDL):  <100     mg/dL   Optimal  119-147  mg/dL   Near or Above                    Optimal  130-159  mg/dL   Borderline  829-562  mg/dL   High  >130     mg/dL   Very High Performed at [facility], [address] [place], [place] [address]   VITAMIN D 25 Hydroxy (Vit-D Deficiency, Fractures)     Status: Abnormal   Collection Time: 11/14/21  6:36 PM  Result Value Ref Range   Vit D, 25-Hydroxy 22.89 (L) 30 - 100 ng/mL    Comment: (NOTE) Vitamin D deficiency has been defined by the Institute of Medicine  and an Endocrine Society practice guideline as a level of serum 25-OH  vitamin D less than 20 ng/mL (1,2). The Endocrine Society went on to  further define vitamin D insufficiency as a level between 21 and 29  ng/mL (2).  1. IOM (Institute of Medicine). 2010. Dietary reference intakes for  calcium and D. [place]: [employer]. 2. Holick MF, Binkley Oblong, Bischoff-Ferrari HA, et al. Evaluation,  treatment, and prevention of vitamin D deficiency: an Endocrine  Society clinical practice guideline, JCEM. 2011 Jul; 96(7): 1911-30.  Performed at [facility], [address], [place], [place] [address]   Vitamin B12     Status: Abnormal   Collection Time: 11/14/21  6:36 PM  Result Value Ref Range   Vitamin B-12 156 (L) 180 - 914 pg/mL    Comment: (NOTE) This assay is not validated for testing neonatal or myeloproliferative syndrome specimens for Vitamin B12 levels. Performed at [facility], [address] [place], [place] [address]   Basic metabolic panel     Status: Abnormal   Collection Time: 11/15/21 10:07 AM  Result Value Ref Range   Sodium 139 135 - 145 mmol/L   Potassium 4.1 3.5 - 5.1 mmol/L   Chloride 105 98 - 111 mmol/L   CO2 29 22 - 32 mmol/L   Glucose, Bld 86 70 - 99 mg/dL    Comment:  Glucose reference range applies only to samples taken after fasting for at least 8 hours.   BUN 21 (H) 6 - 20 mg/dL   Creatinine, Ser 1.32 0.61 - 1.24 mg/dL   Calcium 9.8 8.9 - 44.0 mg/dL   GFR, Estimated >10 >27 mL/min    Comment: (NOTE) Calculated using the CKD-EPI Creatinine Equation (2021)    Anion gap 5 5 - 15    Comment: Performed at [facility], [address] [place], [place] [address]  CBC     Status: None   Collection Time: 11/15/21 10:07 AM  Result Value Ref Range   WBC 5.7 4.0 - 10.5 K/uL   RBC 5.19 4.22 - 5.81 MIL/uL   Hemoglobin 14.5 13.0 - 17.0 g/dL   HCT 44.0 34.7 - 42.5 %   MCV 87.9 80.0 - 100.0 fL   MCH 27.9 26.0 - 34.0 pg   MCHC 31.8 30.0 - 36.0 g/dL   RDW 95.6 38.7 - 56.4 %   Platelets 237 150 - 400 K/uL   nRBC 0.0 0.0 - 0.2 %    Comment: Performed at [facility], [address], [place], [place] [address]  Troponin I (High Sensitivity)     Status: None   Collection Time: 11/15/21 10:07  AM  Result Value Ref Range   Troponin I (High Sensitivity) 7 <18 ng/L    Comment: (NOTE) Elevated high sensitivity troponin I (hsTnI) values and significant  changes across serial measurements may suggest ACS but many other  chronic and acute conditions are known to elevate hsTnI results.  Refer to the "Links" section for chest pain algorithms and additional  guidance. Performed at [facility], [address] [place], [place] [address]   Troponin I (High Sensitivity)     Status: None   Collection Time: 11/15/21 12:15 PM  Result Value Ref Range   Troponin I (High Sensitivity) 7 <18 ng/L    Comment: (NOTE) Elevated high sensitivity troponin I (hsTnI) values and significant  changes across serial measurements may suggest ACS but many other  chronic and acute conditions are known to elevate hsTnI results.  Refer to the "Links" section for chest pain algorithms and additional  guidance. Performed at [facility], [address]  [place], [place] [address]     Blood Alcohol level:  Lab Results  Component Value Date   ETH <10 11/12/2021    Metabolic Disorder Labs: Lab Results  Component Value Date   HGBA1C 5.4 11/14/2021   MPG 108.28 11/14/2021   No results found for: PROLACTIN Lab Results  Component Value Date   CHOL 171 11/14/2021   TRIG 42 11/14/2021   HDL 63 11/14/2021   CHOLHDL 2.7 11/14/2021   VLDL 8 11/14/2021   LDLCALC 100 (H) 11/14/2021    Physical Findings: AIMS: Facial and Oral Movements Muscles of Facial Expression: None, normal Lips and Perioral Area: None, normal Jaw: None, normal Tongue: None, normal,Extremity Movements Upper (arms, wrists, hands, fingers): None, normal Lower (legs, knees, ankles, toes): None, normal, Trunk Movements Neck, shoulders, hips: None, normal, Overall Severity Severity of abnormal movements (highest score from questions above): None, normal Incapacitation due to abnormal movements: None, normal Patient's awareness of abnormal movements (rate only patient's report): No Awareness, Dental Status Current problems with teeth and/or dentures?: No Does patient usually wear dentures?: No  CIWA:    COWS:     Musculoskeletal: Strength & Muscle Tone: within normal limits Gait & Station: normal Patient leans: N/A  Psychiatric Specialty Exam:  Presentation  General Appearance: Appropriate for Environment; Casual; Fairly Groomed  Eye Contact:Fair  Speech:Clear and Coherent  Speech Volume:Normal  Handedness:Right  Mood and Affect  Mood:Euthymic  Affect:Appropriate  Thought Process  Thought Processes:Coherent  Descriptions of Associations:Intact  Orientation:Full (Time, Place and Person)  Thought Content:Logical  History of Schizophrenia/Schizoaffective disorder:No  Duration of Psychotic Symptoms:N/A  Hallucinations:Hallucinations: None  Ideas of Reference:None  Suicidal Thoughts:Suicidal Thoughts: No  Homicidal Thoughts:Homicidal  Thoughts: No  Sensorium  Memory:Immediate Good  Judgment:Fair  Insight:Fair  Executive Functions  Concentration:Good  Attention Span:Good  Recall:Good  Fund of Knowledge:Good  Language:Fair   Psychomotor Activity  Psychomotor Activity:Psychomotor Activity: Normal  Assets  Assets:Communication Skills; Housing; Social Support  Sleep  Sleep:Sleep: Good   Physical Exam: Physical Exam Review of Systems  Constitutional: Negative.   HENT: Negative.    Eyes: Negative.   Respiratory: Negative.    Cardiovascular: Negative.   Gastrointestinal: Negative.   Genitourinary: Negative.   Musculoskeletal: Negative.   Skin: Negative.   Neurological: Negative.   Psychiatric/Behavioral:  Positive for depression. Negative for hallucinations, memory loss, substance abuse and suicidal ideas. The patient is not nervous/anxious and does not have insomnia.   Blood pressure 107/73, pulse (!) 57, temperature 97.9 F (36.6 C),  temperature source Oral, resp. rate 18, height  (1.753 m), weight 64.4 kg, SpO2 100 %. Body mass index is 20.97 kg/m.  Treatment Plan Summary: Daily contact with patient to assess and evaluate symptoms and progress in treatment and Medication management   Observation Level/Precautions:  15 minute checks  Laboratory:  Labs reviewed   Psychotherapy:  Unit Group sessions  Medications:  See [facility]  Consultations:  To be determined   Discharge Concerns:  Safety, medication compliance, mood stability  Estimated LOS: 5-7 days  Other:  N/A    PLAN Safety and Monitoring: Voluntary admission to inpatient psychiatric unit for safety, stabilization and treatment Daily contact with patient to assess and evaluate symptoms and progress in treatment Patient's case to be discussed in multi-disciplinary team meeting Observation Level : q15 minute checks Vital signs: q12 hours Precautions: suicide, elopement, and assault   Long Term Goal(s): Improvement in symptoms so as  ready for discharge   Short Term Goals: Ability to identify changes in lifestyle to reduce recurrence of condition will improve, Ability to disclose and discuss suicidal ideas, Ability to identify and develop effective coping behaviors will improve, and Compliance with prescribed medications will improve   Diagnoses Principal Problem:   Severe bipolar I disorder, most recent episode depressed (HCC) Active Problems:   Insomnia   Medications -Continue Hydroxyzine 25 mg PRN Q 6 H for anxiety   Other PRNS -Continue Tylenol 650 mg every 6 hours PRN for mild pain -Continue Maalox 30 mg every 4 hrs PRN for indigestion -Continue Milk of Magnesia as needed every 6 hrs for constipation   Discharge Planning: Social work and case management to assist with discharge planning and identification of hospital follow-up needs prior to discharge Estimated LOS: 5-7 days Discharge Concerns: Need to establish a safety plan; Medication compliance and effectiveness Discharge Goals: Return home with outpatient referrals for mental health follow-up including medication management/psychotherapy  Starleen Blue, NP 11/15/2021, 3:56 PM

## 2021-11-15 NOTE — Progress Notes (Signed)
   11/15/21 0500  Psych Admission Type (Psych Patients Only)  Admission Status Voluntary  Psychosocial Assessment  Patient Complaints None  Eye Contact Fair  Facial Expression Flat  Affect Appropriate to circumstance  Speech Logical/coherent  Interaction Assertive  Motor Activity Other (Comment) (WDL)  Appearance/Hygiene Unremarkable  Behavior Characteristics Cooperative  Mood Anxious  Thought Process  Coherency WDL  Content WDL  Delusions None reported or observed  Perception WDL  Hallucination None reported or observed  Judgment Impaired  Confusion None  Danger to Self  Current suicidal ideation? Denies  Danger to Others  Danger to Others None reported or observed

## 2021-11-15 NOTE — Progress Notes (Addendum)
Patient ID: Kevin [name], male   DOB: [birth year], 26 y.o.   MRN: [id number] Pt was started on Abilify 5 mg last night for management of his depressive symptoms. This morning, pt complained of lightheadedness, nausea and was diaphoretic. BP dropped & EKG was completed and showed STEMI. Pt is Ox4, and being transferred to the [facility] for medical workup. [facility] called and notified that pt is being sent there. Pt is able to return to this [facility] if medically cleared. Abilify is being discontinued at this time.

## 2021-11-15 NOTE — Progress Notes (Addendum)
Asked by nursing staff to evaluate patient due to dizziness and diaphoresis. Patient reports that he was feeling fine at 4 am. States when he got up for vital signs after 6 am and walked to the dayroom he began to feel lightheaded, nauseated, and diaphoretic. Denies syncopal episode. Denies vomiting. Reports history of similar episode in the past. On evaluation, patient is sitting up in area near nurses station. He is alert and oriented x 4. Speech is clear and coherent. States he is feeling some better. Respirations are even and unlabored. He is no longer diaphoretic, but skin is slightly clammy. Blood pressure has improved. SBP 112 during assessment. On chart review, noted that patient's blood pressure is typically on the lower side. Patient tolerated 240 ml of Gatorade and is currently drinking a second cup. He is concerned that this may be a reaction to Abilify, since he received his first dose last night. Instructed to discuss with providers this morning. Requests medication for nausea. Placed order for an EKG.

## 2021-11-15 NOTE — Progress Notes (Signed)
Pt presents with anxious mood, affect congruent. This am received report that patient had episode of diaphoresis, lightheadedness and low BP. Patient upon approach at nurses station standing stating '' I feel much better after drinking the gatorade. '' Patient denied any chest pain, and encouraged to increase po intake. He did report he felt this may have been related to his abilify stating '' I don't think I should have taken that medication last night, I think I still have too much charcoal in my system. And I just really don't like the way it made me feel. I really think what I need is therapy, to sort through this instead of medications. '' Allowed patient to ventilate and support given. Above was discussed in treatment team, as well as earlier reports of physical symptoms. EKG was completed and noted abnormal, immediately given to provider. Pt was transported emergently via EMS. Patient was returned later this shift to [facility]. Patient states '' I feel normal, I feel good, but they told me I am going to need to see a cardiologist. ''  Pt has been visible on the unit, attending programming once returned . Pt is safe, will con't to monitor.

## 2021-11-16 DIAGNOSIS — F314 Bipolar disorder, current episode depressed, severe, without psychotic features: Principal | ICD-10-CM

## 2021-11-16 DIAGNOSIS — F332 Major depressive disorder, recurrent severe without psychotic features: Secondary | ICD-10-CM

## 2021-11-16 NOTE — Progress Notes (Signed)
Discharge Note:  Patient discharged home with cab voucher.  Suicide prevention information given and discussed with patient who stated he understood and had no questions.  Patient denied SI and HI.  Denied A/V hallucinations.  Patient stated he received all his belongings, clothing, toiletries, misc items, etc.  Patient stated he appreciated all assistance received from [facility] staff.  All required discharge information given.Marland Kitchen

## 2021-11-16 NOTE — Discharge Summary (Signed)
Physician Discharge Summary Note  Patient:  Kevin [name] is an 26 y.o., male MRN:  [id number] DOB:  04/01/1996 Patient phone:  [phone] (home)  Patient address:   [address] [place] [id number],  Total Time spent with patient: 30 minutes  Date of Admission:  11/13/2021 Date of Discharge: 11/16/2021  Reason for Admission:  Kevin [name] is a 26 y.o male with a reported history of bipolar d/o, MDD, GAD & PTSD who presented to the [facility] after overdosing on 12 tablets of Lamictal (25 mg each) after his dog died. 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 For this admission, pt was deemed to be a danger to self, and voluntarily transferred [facility] this [facility] for treatment and stabilization of his mood.  Principal Problem: Severe bipolar I disorder, most recent episode depressed (HCC) Discharge Diagnoses: Principal Problem:   Severe bipolar I disorder, most recent episode depressed (HCC) Active Problems:   Insomnia  Past Psychiatric History: As above  Past Medical History:  Past Medical History:  Diagnosis Date   Bipolar depression (HCC)    Thrombosed hemorrhoids     Past Surgical History:  Procedure Laterality Date   APPENDECTOMY     Family History:  Family History  Problem Relation Age of Onset   Colon cancer Other        grandfather   Pancreatic cancer Other        aunt   Family Psychiatric  History: None reported Social History:  Social History   Substance and Sexual Activity  Alcohol Use Not Currently     Social History   Substance and Sexual Activity  Drug Use Never    Social History   Socioeconomic History   Marital status: Single    Spouse name: Not on file   Number of children: Not on file   Years of education: Not on file   Highest education level: Not on file   Occupational History   Not on file  Tobacco Use   Smoking status: Never   Smokeless tobacco: Never  Vaping Use   Vaping Use: Never used  Substance and Sexual Activity   Alcohol use: Not Currently   Drug use: Never   Sexual activity: Not on file  Other Topics Concern   Not on file  Social History Narrative   Not on file   Social Determinants of Health   Financial Resource Strain: Not on file  Food Insecurity: Not on file  Transportation Needs: Not on file  Physical Activity: Not on file  Stress: Not on file  Social Connections: Not on file                                    HOSPITAL COURSE During the patient's hospitalization, patient had extensive initial psychiatric evaluation, and follow-up psychiatric evaluations every day.  Patient was started on Abilify 5 mg for stabilization of his mood, and took one dose of this medication on 11/14/2021, and on the morning of 11/15/2021, he complained of lightheadedness, chest pain, nausea, dizziness, became diaphoretic, and an EKG was performed. The EKG showed some changes as compared to the initial EKG that was completed on admission. Pt reported at time of EKG completion that he has a history of takotsubo  Cardiomyopathy, which was never revealed during his initial admissions assessment. Pt reported on admission that his only medical condition was Crohn's disease. A decision was made on the morning of 11/15/2021 after EKG was completed to discontinue the Abilify, and send pt to [facility] for medical workup due to the cardiac related symptoms that he was exhibiting. Cardiac enzymes while in the ER were negative, and an EKG completed in the ER was WNL. Pt received 324mg  of Aspirin on his way to the ER (given by EMS). Pt was given IV fluids while in the ER, another EKG was performed in the ER and was WNL.  His symptoms resolved & he was medically cleared and sent back to this [facility]. He has denied any cardiac related symptoms since returning  from the ER. Pt has been educated to go to his outpatient mental health provider after discharge for medication management. Pt is not being discharged from this [facility] with any medications.   Patient's care was discussed during the interdisciplinary team meeting every day during the hospitalization.  Pt was initially resistant to staying at this [facility] after admission, signed a 72 hr notice for discharge, but rescinded it.  Gradually, patient started adjusting to milieu. The patient was evaluated each day by a clinical provider to ascertain response to treatment. Improvement was noted by the patient's report of decreasing symptoms, improved sleep and appetite, affect, medication tolerance, behavior, and participation in unit programming.  Patient was asked each day to complete a self inventory noting mood, mental status, pain, new symptoms, anxiety and concerns.  Symptoms were reported as significantly decreased or resolved completely by discharge. Pt reports that the educational material on grief & loss provided to him during this hospitalization has been helpful, and he is moving rapidly through the stages of grief, and is now able to accept the loss of his dog.   On day of discharge, the patient reports that their mood is stable. The patient denied having suicidal thoughts for more than 48 hours prior to discharge.  Patient denies having homicidal thoughts.  Patient denies having auditory hallucinations.  Patient denies any visual hallucinations or other symptoms of psychosis. The patient is motivated to continue taking medications that will be prescribed with a goal of continued improvement in mental health.    The patient reports their target psychiatric symptoms of depression are resolving. The patient reports overall benefit other psychiatric hospitalization. Supportive psychotherapy was provided to the patient. The patient also participated in regular group therapy while hospitalized. Coping skills,  problem solving as well as relaxation therapies were also part of the unit programming.   Labs were reviewed with the patient, and abnormal results were discussed with the patient. Vitamin D is low at 22.89 and Vitamin B12 is low at 156. Pt has been educated to follow these up with his primary care provider after discharge.   Physical Findings: AIMS: Facial and Oral Movements Muscles of Facial Expression: None, normal Lips and Perioral Area: None, normal Jaw: None, normal Tongue: None, normal,Extremity Movements Upper (arms, wrists, hands, fingers): None, normal Lower (legs, knees, ankles, toes): None, normal, Trunk Movements Neck, shoulders, hips: None, normal, Overall Severity Severity of abnormal movements (highest score from questions above): None, normal Incapacitation due to abnormal movements: None, normal Patient's awareness of abnormal movements (rate only patient's report): No Awareness, Dental Status Current problems with teeth and/or dentures?: No Does patient usually wear dentures?: No  CIWA:  n/a  COWS: n/a    Musculoskeletal:  Strength & Muscle Tone: within normal limits Gait & Station: normal Patient leans: N/A  Psychiatric Specialty Exam:  Presentation  General Appearance: Appropriate for Environment; Well Groomed  Eye Contact:Good  Speech:Clear and Coherent  Speech Volume:Normal  Handedness:Right  Mood and Affect  Mood:Euthymic  Affect:Appropriate  Thought Process  Thought Processes:Coherent  Descriptions of Associations:Intact  Orientation:Full (Time, Place and Person)  Thought Content:Logical  History of Schizophrenia/Schizoaffective disorder:No  Duration of Psychotic Symptoms:N/A  Hallucinations:Hallucinations: None  Ideas of Reference:None  Suicidal Thoughts:Suicidal Thoughts: No  Homicidal Thoughts:Homicidal Thoughts: No  Sensorium  Memory:Immediate Good  Judgment:Good  Insight:Good  Executive Functions   Concentration:Good  Attention Span:Good  Recall:Good  Fund of Knowledge:Good  Language:Good  Psychomotor Activity  Psychomotor Activity:Psychomotor Activity: Normal  Assets  Assets:Communication Skills  Sleep  Sleep:Sleep: Good  Physical Exam: Physical Exam HENT:     Head: Normocephalic.  Eyes:     Pupils: Pupils are equal, round, and reactive to light.  Cardiovascular:     Rate and Rhythm: Normal rate.  Musculoskeletal:        General: Normal range of motion.     Cervical back: Normal range of motion.  Neurological:     Mental Status: He is alert and oriented to person, place, and time.  Psychiatric:        Mood and Affect: Mood normal.        Behavior: Behavior normal.   Review of Systems  Constitutional: Negative.   HENT: Negative.    Eyes: Negative.   Cardiovascular: Negative.   Gastrointestinal: Negative.   Genitourinary: Negative.   Musculoskeletal: Negative.   Skin: Negative.   Neurological: Negative.   Psychiatric/Behavioral:  Positive for depression (Pt reports that his depressive symptoms are resolving. He denies SI/HI/AVH and verbally contracts for safety outside of this [facility]). Negative for hallucinations, memory loss, substance abuse and suicidal ideas. The patient is not nervous/anxious and does not have insomnia.   Blood pressure 119/67, pulse 82, temperature 98.1 F (36.7 C), temperature source Oral, resp. rate 16, height 5\' 9"  (1.753 m), weight 64.4 kg, SpO2 97 %. Body mass index is 20.97 kg/m.  Social History   Tobacco Use  Smoking Status Never  Smokeless Tobacco Never   Tobacco Cessation:  N/A, patient does not currently use tobacco products  Blood Alcohol level:  Lab Results  Component Value Date   ETH <10 11/12/2021    Metabolic Disorder Labs:  Lab Results  Component Value Date   HGBA1C 5.4 11/14/2021   MPG 108.28 11/14/2021   No results found for: PROLACTIN Lab Results  Component Value Date   CHOL 171 11/14/2021   TRIG  42 11/14/2021   HDL 63 11/14/2021   CHOLHDL 2.7 11/14/2021   VLDL 8 11/14/2021   LDLCALC 100 (H) 11/14/2021   See Psychiatric Specialty Exam and Suicide Risk Assessment completed by Attending Physician prior to discharge.  Discharge destination:  Home  Is patient on multiple antipsychotic therapies at discharge:  No   Has Patient had three or more failed trials of antipsychotic monotherapy by history:  No  Recommended Plan for Multiple Antipsychotic Therapies: NA  Discharge Instructions     Diet - low sodium heart healthy   Complete by: As directed    Increase activity slowly   Complete by: As directed       Allergies as of 11/16/2021   No Known Allergies      Medication List    You have not been prescribed any medications.  Follow-up Information     Clinic, [facility] Call.   Why: Please contact this provider to schedule a hospital follow up appointment for therapy and medication management services, as we were unable to contact prior to discharge.   If you need to been seen earlier please present for walkin clinic Mon-Fri at 0800 AM. Contact information: [address] [facility] [place] [address] [phone]         [facility] EMERGENCY DEPARTMENT .   Specialty: Emergency Medicine Why: If symptoms worsen Contact information: [address] [id number] mc [place] [address] [phone]        [facility] [employer]. Schedule an appointment as soon as possible for a visit .   Specialty: Cardiology Contact information: [address] [place] [address] [phone]               Follow-up recommendations:   The patient is able to verbalize their individual safety plan to this provider.   # It is recommended to the patient to continue psychiatric medications as prescribed, after discharge from the hospital.     # It is recommended to the  patient to follow up with your outpatient psychiatric provider and PCP.   # It was discussed with the patient, the impact of alcohol, drugs, tobacco have been there overall psychiatric and medical wellbeing, and total abstinence from substance use was recommended the patient.ed.   # Prescriptions provided or sent directly to preferred pharmacy at discharge. Patient agreeable to plan. Given opportunity to ask questions. Appears to feel comfortable with discharge.    # In the event of worsening symptoms, the patient is instructed to call the crisis hotline (988), 911 and or go to the nearest ED for appropriate evaluation and treatment of symptoms. To follow-up with primary care provider for other medical issues, concerns and or health care needs   # Patient was discharged home with a plan to follow up as noted above. [facility], and awaiting a return call from the [place]. Pt is currently unwilling to wait for the [facility] provider listed above to return call to [facility], and states that he will walk into the [place] and get an appointment instead. He is agreeable to going to the [place] today after discharge to get the appointment.  Signed: Starleen Blue, NP 11/16/2021, 10:56 AM

## 2021-11-16 NOTE — Plan of Care (Signed)
Nurse discussed coping skills with patient.  

## 2021-11-16 NOTE — Progress Notes (Signed)
  [facility] Adult Case Management Discharge Plan :  Will you be returning to the same living situation after discharge:  Yes,  Patient to return to place or residence living with friend Eldridge Dace.  At discharge, do you have transportation home?: Yes,  Patient to be provided with cab voucher to [facility] where his vehicle is parked.  Do you have the ability to pay for your medications: Yes,  [facility] Benefits.   Release of information consent forms completed and in the chart;  Patient's signature needed at discharge.  Patient to Follow up at:  Follow-up Information     Clinic, [place]. Call.   Why: Please contact this provider to schedule a hospital follow up appointment for therapy and medication management services, as we were unable to contact prior to discharge.   If you need to been seen earlier please present for walkin clinic Mon-Fri at 0800 AM. Contact information: [place] [address] [phone]         [facility] EMERGENCY DEPARTMENT .   Specialty: Emergency Medicine Why: If symptoms worsen Contact information: [address] [id number] [place] [phone]        [place]. Schedule an appointment as soon as possible for a visit .   Specialty: Cardiology Contact information: [address] [place] [phone]                Next level of care provider has access to [place] and Suicide Prevention discussed: Yes,  SPE completed with patient and Eldridge Dace, friend/roommate.   Per writer'snote on 11/16/2021, "In addition to the precautions below, patient's support agrees to monitor patient for safety, ensure treatment compliance, and alert emergency services should patient require such services. Friend confirms patient's dog's medications  have been secure and will  be properly disposed of. States he is heading home from work to monitor patient when he arrives to the home. Further states that the patient's best friend Joesph Fillers will be staying with patient for some period of time after discharge. "   Has patient been referred to the Quitline?: N/A patient is not a smoker Tobacco Use: Low Risk    Smoking Tobacco Use: Never   Smokeless Tobacco Use: Never   Passive Exposure: Not on file   Patient has been referred for addiction treatment: N/A Patient denies active substance use, UDS Negative for all, screened low risk during nursing admission (see [facility] quick tab).   Social History   Substance and Sexual Activity  Drug Use Never   Alcohol Level    Component Value Date/Time   [facility] <10 11/12/2021 2230   Social History   Substance and Sexual Activity  Alcohol Use Not Currently    Larose Kells 11/16/2021, 10:37 AM

## 2021-11-16 NOTE — Progress Notes (Signed)
   11/16/21 0500  Sleep  Number of Hours 6.25    

## 2021-11-16 NOTE — [facility] Suicide Risk Assessment (Addendum)
Suicide Risk Assessment  Discharge Assessment    [facility] Psychiatric Discharge Suicide Risk Assessment   Principal Problem: Severe bipolar I disorder, most recent episode depressed (HCC) Discharge Diagnoses: Principal Problem:   Severe bipolar I disorder, most recent episode depressed (HCC) Active Problems:   Insomnia  Reason For Admission: Kevin [name] is a 26 y.o male with a reported history of bipolar d/o, MDD, GAD & PTSD who presented to the [facility] after overdosing on 12 tablets of Lamictal (25 mg each) after his dog died. As per chart review, pt has a history of a suicide attempt via cutting his wrists in 2018 while serving in the [employer], requiring hospitalization at the [facility] center at [place], [place]. For this admission, pt was deemed to be a danger to self, and voluntarily transferred to this [facility] for treatment and stabilization of his mood.                                         HOSPITAL COURSE During the patient's hospitalization, patient had extensive initial psychiatric evaluation, and follow-up psychiatric evaluations every day.  Patient was started on Abilify 5 mg for stabilization of his mood, and took one dose of this medication on 11/14/2021, and on the morning of 11/15/2021, he complained of lightheadedness, chest pain, nausea, dizziness, became diaphoretic, and an EKG was performed. The EKG showed some changes as compared to the initial EKG that was completed on admission. Pt reported at time of EKG completion that he has a history of takotsubo Cardiomyopathy, which was never revealed during his initial admissions assessment. Pt reported on admission that his only medical condition was Crohn's disease. A decision was made on the morning of 11/15/2021 after EKG was completed to discontinue the Abilify, and send pt to [facility] for medical workup due to the cardiac related symptoms that he was exhibiting. Cardiac enzymes while in the ER were negative,  and an EKG completed in the ER was WNL. Pt received 324mg  of Aspirin on his way to the ER (given by EMS). Pt was given IV fluids while in the ER, another EKG was performed in the ER and was WNL.  His symptoms resolved & he was medically cleared and sent back to this [facility]. He has denied any cardiac related symptoms since returning from the ER. Pt has been educated to go to his outpatient mental health provider after discharge for medication management. Pt is not being discharged from this [facility] with any medications.  Patient's care was discussed during the interdisciplinary team meeting every day during the hospitalization.  Pt was initially resistant to staying at this [facility] after admission, signed a 72 hr notice for discharge, but rescinded it.  Gradually, patient started adjusting to milieu. The patient was evaluated each day by a clinical provider to ascertain response to treatment. Improvement was noted by the patient's report of decreasing symptoms, improved sleep and appetite, affect, medication tolerance, behavior, and participation in unit programming.  Patient was asked each day to complete a self inventory noting mood, mental status, pain, new symptoms, anxiety and concerns.  Symptoms were reported as significantly decreased or resolved completely by discharge. Pt reports that the educational material on grief & loss provided to him during this hospitalization has been helpful, and he is moving rapidly through the stages of grief, and is now able to accept the loss  of his dog.  On day of discharge, the patient reports that their mood is stable. The patient denied having suicidal thoughts for more than 48 hours prior to discharge.  Patient denies having homicidal thoughts.  Patient denies having auditory hallucinations.  Patient denies any visual hallucinations or other symptoms of psychosis. The patient is motivated to continue taking medications that will be prescribed with a goal of continued  improvement in mental health.   The patient reports their target psychiatric symptoms of depression are resolving. The patient reports overall benefit other psychiatric hospitalization. Supportive psychotherapy was provided to the patient. The patient also participated in regular group therapy while hospitalized. Coping skills, problem solving as well as relaxation therapies were also part of the unit programming.  Labs were reviewed with the patient, and abnormal results were discussed with the patient. Vitamin D is low at 22.89 and Vitamin B12 is low at 156. Pt has been educated to follow these up with his primary care provider after discharge.  Total Time spent with patient: 30 minutes  Musculoskeletal: Strength & Muscle Tone: within normal limits Gait & Station: normal Patient leans: N/A  Psychiatric Specialty Exam  Presentation  General Appearance: Appropriate for Environment; Well Groomed  Eye Contact:Good  Speech:Clear and Coherent  Speech Volume:Normal  Handedness:Right  Mood and Affect  Mood:Euthymic  Duration of Depression Symptoms: Less than two weeks  Affect:Appropriate  Thought Process  Thought Processes:Coherent  Descriptions of Associations:Intact  Orientation:Full (Time, Place and Person)  Thought Content:Logical  History of Schizophrenia/Schizoaffective disorder:No  Duration of Psychotic Symptoms:N/A  Hallucinations:Hallucinations: None  Ideas of Reference:None  Suicidal Thoughts:Suicidal Thoughts: No  Homicidal Thoughts:Homicidal Thoughts: No  Sensorium  Memory:Immediate Good  Judgment:Good  Insight:Good  Executive Functions  Concentration:Good  Attention Span:Good  Recall:Good  Fund of Knowledge:Good  Language:Good  Psychomotor Activity  Psychomotor Activity:Psychomotor Activity: Normal  Assets  Assets:Communication Skills  Sleep  Sleep:Sleep: Good  Physical Exam: Physical Exam HENT:     Head: Normocephalic.   Pulmonary:     Effort: Pulmonary effort is normal.  Musculoskeletal:        General: Normal range of motion.     Cervical back: Normal range of motion.  Neurological:     Mental Status: He is alert and oriented to person, place, and time.   Review of Systems  Constitutional: Negative.   HENT: Negative.    Cardiovascular:  Negative for chest pain.  Skin: Negative.    Blood pressure 119/67, pulse 82, temperature 98.1 F (36.7 C), temperature source Oral, resp. rate 16, height  (1.753 m), weight 64.4 kg, SpO2 97 %. Body mass index is 20.97 kg/m.  Mental Status Per Nursing Assessment::   On Admission:  Self-harm behaviors, Self-harm thoughts  Demographic Factors:  Male  Loss Factors: Loss of dog which led to recent overdose as he verbalized feelings of wanting to be with his dog prior to taking the overdose of Lamictal.  Historical Factors: Impulsivity  Risk Reduction Factors:   Positive social support  Continued Clinical Symptoms:  More than one psychiatric diagnosis-Patient currently denies SI/HI/AVH and is verbally contracting for safety outside of this [facility].  Cognitive Features That Contribute To Risk:  None    Suicide Risk:  Mild:   There are no identifiable suicide plans, no associated intent, mild dysphoria and related symptoms, good self-control (both objective and subjective assessment), few other risk factors, and identifiable protective factors, including available and accessible social support.    Follow-up Information  [facility], [facility] Call.   Why: Please contact this provider to schedule a hospital follow up appointment for therapy and medication management services, as we were unable to contact prior to discharge.   If you need to been seen earlier please present for walkin clinic Mon-Fri at 0800 AM. Contact information: [address] [facility] [address] [place] [id number] [phone]         [facility] .   Specialty: Emergency Medicine Why: If symptoms worsen Contact information: [address] [id number] mc [place] [id number] [phone]        [facility] [address] [facility]. Schedule an appointment as soon as possible for a visit .   Specialty: Cardiology Contact information: [address], [address] [place] [phone] [phone]                Plan Of Care/Follow-up recommendations:  The patient is able to verbalize their individual safety plan to this provider.  # It is recommended to the patient to continue psychiatric medications as prescribed, after discharge from the hospital.    # It is recommended to the patient to follow up with your outpatient psychiatric provider and PCP.  # It was discussed with the patient, the impact of alcohol, drugs, tobacco have been there overall psychiatric and medical wellbeing, and total abstinence from substance use was recommended the patient.ed.  # Prescriptions provided or sent directly to preferred pharmacy at discharge. Patient agreeable to plan. Given opportunity to ask questions. Appears to feel comfortable with discharge.    # In the event of worsening symptoms, the patient is instructed to call the crisis hotline (988), 911 and or go to the nearest ED for appropriate evaluation and treatment of symptoms. To follow-up with primary care provider for other medical issues, concerns and or health care needs  # Patient was discharged home with a plan to follow up as noted above. [facility] to [facility] for pt, and [place]. Pt is currently unwilling to wait for the [facility] provider listed above to [facility], and states that he [place] [facility] [place] and get an appointment instead. He is agreeable to [place] today after [place] to get the appointment.  Starleen Blueoris  Nkwenti,  NP 11/16/2021, 10:57 AM  Total Time Spent in Direct Patient Care:  I personally spent 40 minutes on the unit in direct patient care. The direct patient care time included face-to-face time with the patient, reviewing the patient's chart, communicating with other professionals, and coordinating care. Greater than 50% of this time was spent in counseling or coordinating care with the patient regarding goals of hospitalization, psycho-education, and discharge planning needs.  On my assessment the patient denied SI, HI, AVH, paranoia, ideas of reference, or first rank symptoms on day of discharge. Patient denied drug cravings or active signs of withdrawal. Patient denied medication side-effects. Patient was not deemed to be a danger to self or others on day of discharge and was in agreement with discharge plans.   I have independently evaluated the patient during a face-to-face assessment on the day of discharge. I reviewed the patient's chart, and I participated in key portions of the service. I discussed the case with the APP, and I agree with the assessment and plan of care as documented in the APP's note, as addended by me or notated  below:  See my attestation of the dc summary for additional details regarding hospital course. I directly edited the SRA, as above.   Phineas Inches, MD Psychiatrist

## 2021-11-16 NOTE — [facility] Suicide Risk Assessment (Signed)
[facility] INPATIENT:  Family/Significant Other Suicide Prevention Education  Suicide Prevention Education:  Education Completed; Kevin [name], friend [phone]  (name of family member/significant other) has been identified by the patient as the family member/significant other with whom the patient will be residing, and identified as the person(s) who will aid the patient in the event of a mental health crisis (suicidal ideations/suicide attempt).  With written consent from the patient, the family member/significant other has been provided the following suicide prevention education, prior to the and/or following the discharge of the patient.  In addition to the precautions below, patient's support agrees to monitor patient for safety, ensure treatment compliance, and alert emergency services should patient require such services. Friend confirms patient's dog's medications  have been secure and will be properly disposed of. States he is heading home from work to monitor patient when he arrives to the home. Further states that the patient's best friend Kevin [name] will be staying with patient for some period of time after discharge.   The suicide prevention education provided includes the following: Suicide risk factors Suicide prevention and interventions National Suicide Hotline telephone number [facility] assessment telephone number [facility] Emergency Assistance 911 [facility] and/or Residential Mobile Crisis Unit telephone number  Request made of family/significant other to: Remove weapons (e.g., guns, rifles, knives), all items previously/currently identified as safety concern.   Remove drugs/medications (over-the-counter, prescriptions, illicit drugs), all items previously/currently identified as a safety concern.  The family member/significant other verbalizes understanding of the suicide prevention education information provided.  The family member/significant other agrees  to remove the items of safety concern listed above.  Kevin [name] 11/16/2021, 10:08 AM

## 2021-11-16 NOTE — Plan of Care (Signed)
Nurse discussed anxiety with patient.  

## 2021-11-16 NOTE — Progress Notes (Signed)
D:  Patient denied SI and HI, contracts for safety.  Denied A/V hallucinations.  Denied pain. A:  Patient did not want medications this morning.  Emotional support and encouragement given patient. R:  Safety maintained with 15 minute checks.

## 2021-12-29 ENCOUNTER — Emergency Department (HOSPITAL_BASED_OUTPATIENT_CLINIC_OR_DEPARTMENT_OTHER)
Admission: EM | Admit: 2021-12-29 | Discharge: 2021-12-29 | Disposition: A | Discharge status: Home / Self Care | Payer: No Typology Code available for payment source | Attending: Emergency Medicine | Admitting: Emergency Medicine

## 2021-12-29 ENCOUNTER — Encounter (HOSPITAL_BASED_OUTPATIENT_CLINIC_OR_DEPARTMENT_OTHER): Payer: Self-pay | Admitting: Obstetrics and Gynecology

## 2021-12-29 ENCOUNTER — Other Ambulatory Visit: Payer: Self-pay

## 2021-12-29 DIAGNOSIS — R1030 Lower abdominal pain, unspecified: Secondary | ICD-10-CM | POA: Insufficient documentation

## 2021-12-29 DIAGNOSIS — R109 Unspecified abdominal pain: Secondary | ICD-10-CM

## 2021-12-29 LAB — CBC
HCT: 45.1 % (ref 39.0–52.0)
Hemoglobin: 14.7 g/dL (ref 13.0–17.0)
MCH: 28.1 pg (ref 26.0–34.0)
MCHC: 32.6 g/dL (ref 30.0–36.0)
MCV: 86.2 fL (ref 80.0–100.0)
Platelets: 239 10*3/uL (ref 150–400)
RBC: 5.23 MIL/uL (ref 4.22–5.81)
RDW: 12.2 % (ref 11.5–15.5)
WBC: 7.5 10*3/uL (ref 4.0–10.5)
nRBC: 0 % (ref 0.0–0.2)

## 2021-12-29 LAB — COMPREHENSIVE METABOLIC PANEL
ALT: 8 U/L (ref 0–44)
AST: 18 U/L (ref 15–41)
Albumin: 5.4 g/dL — ABNORMAL HIGH (ref 3.5–5.0)
Alkaline Phosphatase: 33 U/L — ABNORMAL LOW (ref 38–126)
Anion gap: 8 (ref 5–15)
BUN: 23 mg/dL — ABNORMAL HIGH (ref 6–20)
CO2: 28 mmol/L (ref 22–32)
Calcium: 10.3 mg/dL (ref 8.9–10.3)
Chloride: 103 mmol/L (ref 98–111)
Creatinine, Ser: 1.04 mg/dL (ref 0.61–1.24)
GFR, Estimated: >60 mL/min (ref >60)
Glucose, Bld: 92 mg/dL (ref 70–99)
Potassium: 4.3 mmol/L (ref 3.5–5.1)
Sodium: 139 mmol/L (ref 135–145)
Total Bilirubin: 0.6 mg/dL (ref 0.3–1.2)
Total Protein: 7.9 g/dL (ref 6.5–8.1)

## 2021-12-29 LAB — LIPASE, BLOOD: Lipase: 45 U/L (ref 11–51)

## 2021-12-29 NOTE — ED Provider Notes (Signed)
[facility] EMERGENCY DEPT Provider Note   CSN: [id number] Arrival date & time: 12/29/21  1722     History  Chief Complaint  Patient presents with   Abdominal Pain    Kevin [name] is a 26 y.o. male.   Abdominal Pain  Patient is a 26 year old male with past medical history significant for bipolar depression  He presents emergency room today with complaints of several months of some lower abdominal discomfort when he is straining to defecate or straining in general.  No new symptoms today he simply states that his symptoms have continued and when he attempted to go to the [place] earlier today he states that he was told there was a 20 something hour.  He denies any nausea vomiting diarrhea no blood in his stool.  No changes with his bowel movements.  He is not having any urinary frequency urgency dysuria or hematuria.  No fevers at states he is not really having any pain at this moment.  No other associated symptoms.  No aggravating mitigating factors apart from those discussed above.     Home Medications Prior to Admission medications   Not on File      Allergies    Patient has no known allergies.    Review of Systems   Review of Systems  Gastrointestinal:  Positive for abdominal pain.    Physical Exam Updated Vital Signs BP 113/68 (BP Location: Right Arm)   Pulse (!) 58   Temp 98 F (36.7 C)   Resp 16   Ht 5\' 9"  (1.753 m)   Wt 59 kg   SpO2 100%   BMI 19.20 kg/m  Physical Exam Vitals and nursing note reviewed.  Constitutional:      General: He is not in acute distress. HENT:     Head: Normocephalic and atraumatic.     Nose: Nose normal.  Eyes:     General: No scleral icterus. Cardiovascular:     Rate and Rhythm: Normal rate and regular rhythm.     Pulses: Normal pulses.     Heart sounds: Normal heart sounds.  Pulmonary:     Effort: Pulmonary effort is normal. No respiratory distress.     Breath sounds: No wheezing.  Abdominal:      Palpations: Abdomen is soft.     Tenderness: There is no abdominal tenderness.     Comments: Abdomen is soft nontender.  No guarding or rebound.  Musculoskeletal:     Cervical back: Normal range of motion.     Right lower leg: No edema.     Left lower leg: No edema.  Skin:    General: Skin is warm and dry.     Capillary Refill: Capillary refill takes less than 2 seconds.  Neurological:     Mental Status: He is alert. Mental status is at baseline.  Psychiatric:        Mood and Affect: Mood normal.        Behavior: Behavior normal.    ED Results / Procedures / Treatments   Labs (all labs ordered are listed, but only abnormal results are displayed) Labs Reviewed  COMPREHENSIVE METABOLIC PANEL - Abnormal; Notable for the following components:      Result Value   BUN 23 (*)    Albumin 5.4 (*)    Alkaline Phosphatase 33 (*)    All other components within normal limits  LIPASE, BLOOD  CBC    EKG None  Radiology No results found.  Procedures Procedures  Medications Ordered in ED Medications - No data to display  ED Course/ Medical Decision Making/ A&P                           Medical Decision Making Amount and/or Complexity of Data Reviewed Labs: ordered.   This patient presents to the ED for concern of intermittent abdominal pain, this involves a number of treatment options, and is a complaint that carries with it a moderate risk of complications and morbidity.  The differential diagnosis includes possible hernia (his main concern). The causes of generalized abdominal pain include but are not limited to AAA, mesenteric ischemia, appendicitis, diverticulitis, DKA, gastritis, gastroenteritis, AMI, nephrolithiasis, pancreatitis, peritonitis, adrenal insufficiency,lead poisoning, iron toxicity, intestinal ischemia, constipation, UTI,SBO/LBO, splenic rupture, biliary disease, IBD, IBS, PUD, or hepatitis.   Co morbidities: Discussed in HPI   Brief  History:   Patient is a 26 year old male with past medical history significant for bipolar depression  He presents emergency room today with complaints of several months of some lower abdominal discomfort when he is straining to defecate or straining in general.  No new symptoms today he simply states that his symptoms have continued and when he attempted to go to the [place] earlier today he states that he was told there was a 20 something hour.  He denies any nausea vomiting diarrhea no blood in his stool.  No changes with his bowel movements.  He is not having any urinary frequency urgency dysuria or hematuria.  No fevers at states he is not really having any pain at this moment.  No other associated symptoms.  No aggravating mitigating factors apart from those discussed above.   Physical exam unremarkable.    EMR reviewed including pt PMHx, past surgical history and past visits to ER.   See HPI for more details   Lab Tests:    I personally reviewed all laboratory work and imaging.  Metabolic panel without any acute abnormality specifically kidney function within normal limits and no significant electrolyte abnormalities. CBC without leukocytosis or significant anemia.  Lipase within normal limits   Imaging Studies:  No imaging studies ordered for this patient    Cardiac Monitoring:  NA NA   Medicines ordered:  Patient is symptom-free at this moment.  No medications ordered   Critical Interventions:     Consults/Attending Physician      Reevaluation:  After the interventions noted above I re-evaluated patient and found that they have :   Social Determinants of Health:      Problem List / ED Course:  Patient currently has no symptoms but has had some intermittent lower abdominal pain for quite some time.  He is concerned about a hernia I recommend he follow-up with PCP he does not currently have a PCP.  Recommend he follow-up with general surgery.   The obstructive symptoms.  Will discharge home at this time with return precautions and follow-up instructions.   Dispostion:  After consideration of the diagnostic results and the patients response to treatment, I feel that the patent would benefit from outpatient follow-up with PCP   Final Clinical Impression(s) / ED Diagnoses Final diagnoses:  Left sided abdominal pain    Rx / DC Orders ED Discharge Orders     None         Gailen Shelter, [place] 12/29/21 2028    Vanetta Mulders, MD 01/05/22 1644

## 2021-12-29 NOTE — ED Notes (Signed)
Discharge paperwork given and understood. 

## 2021-12-29 NOTE — Discharge Instructions (Addendum)
Please follow-up with your primary care provider.  I have also given you the information for the general surgery team that you may follow-up with.  You are correct that these symptoms may well be a hernia. Often hernias are treated with ibuprofen and minimizing straining.   Use stool softeners like miralax to keep your stool soft.

## 2021-12-29 NOTE — ED Notes (Addendum)
Few weeks prior while squating weights, Pt felt a "pop" in the left side of his groin/abdomen. Ever since, Pt has been having pain in the same location, increase in pain while palpation/movement. No obvious injury to location, nothing ridge/firm. Pt stated nothing obviously different about his scrotum. Rates pain 9/10 while moving/palpation. Pain also increase while urinating/defecating.

## 2021-12-29 NOTE — ED Triage Notes (Signed)
Patient reports to the ER for abdominal pain that worsens with any type of movement and with bowel movements. Patient reports he is concerned about an inguinal hernia.

## 2022-01-16 ENCOUNTER — Ambulatory Visit: Payer: Self-pay

## 2022-01-16 ENCOUNTER — Ambulatory Visit (INDEPENDENT_AMBULATORY_CARE_PROVIDER_SITE_OTHER): Payer: No Typology Code available for payment source | Admitting: Orthopedic Surgery

## 2022-01-16 DIAGNOSIS — M25532 Pain in left wrist: Secondary | ICD-10-CM | POA: Diagnosis not present

## 2022-01-16 DIAGNOSIS — S52552A Other extraarticular fracture of lower end of left radius, initial encounter for closed fracture: Secondary | ICD-10-CM | POA: Diagnosis not present

## 2022-01-16 MED ORDER — MELOXICAM 7.5 MG PO TABS: 7.5000 mg | ORAL_TABLET | Freq: Every day | ORAL | 0 refills | Status: AC

## 2022-01-16 NOTE — Progress Notes (Signed)
Office Visit Note   Patient: Kevin [name]           Date of Birth: 06/17/1996           MRN: [id number] Visit Date: 01/16/2022              Requested by: Jeanine Luz [address] [facility] [place],  [place] [address] PCP: [facility], [facility]   Assessment & Plan: Visit Diagnoses:  1. Other closed extra-articular fracture of distal end of left radius, initial encounter   2. Pain in left wrist     Plan: We reviewed patient's x-rays which show a healed nondisplaced distal radius fracture.  His pain seems to be localized right over the EPL tendon adjacent to Lister's tubercle.  The EPL function is intact and he is able to retropulse his thumb and able to extend the IP joint against resistance.  I am somewhat concerned about inflammation in the third dorsal compartment which could potentially lead to spontaneous EPL rupture which is sometimes seen after nondisplaced distal radius fractures.  I would like to put him in a thumb spica brace for now I will order an ultrasound to look for inflammation in the third dorsal compartment.  We discussed that if inflammation is present, he may benefit from a prophylactic EPL tendon release to prevent rupture.  I can see him back in once ultrasound completed.  Follow-Up Instructions: No follow-ups on file.   Orders:  Orders Placed This Encounter  Procedures   XR Wrist Complete Left   No orders of the defined types were placed in this encounter.     Procedures: No procedures performed   Clinical Data: No additional findings.   Subjective: Chief Complaint  Patient presents with   Left Wrist - Follow-up, Fracture, Pain    This is a 26 year old right-hand-dominant male who presents with left wrist pain.  He had a nondisplaced extra-articular fracture of left distal radius that was treated nonoperatively in a cast.  He is approximately 8+ months out from this injury.  He is had severe, sharp throbbing pain in the left  wrist.  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  His wrist pain started around the same time.  His pain is localized to the dorsal aspect of the wrist.  It seems to be directly over Lister's tubercle.  He has not had any treatment for this so far.  He says that his pain was so severe last night that he could not sleep.  He denies any recent injury to his wrist other than starting a new job.    Review of Systems   Objective: Vital Signs: There were no vitals taken for this visit.  Physical Exam Constitutional:      Appearance: Normal appearance.  Cardiovascular:     Rate and Rhythm: Normal rate.     Pulses: Normal pulses.  Pulmonary:     Effort: Pulmonary effort is normal.  Skin:    General: Skin is warm and dry.     Capillary Refill: Capillary refill takes less than 2 seconds.  Neurological:     Mental Status: He is alert.     Left Hand Exam   Tenderness  Left hand tenderness location: TTP directly over Lister's tubercle and third dorsal compartment.   Range of Motion  The patient has normal left wrist ROM.  Other  Erythema: absent Sensation: normal Pulse: present  Comments:  Retropulsion and IP  extension of thumb intact. He feels a "pulling" sensation in wrist w/ thumb retropulsion.       Specialty Comments:  No specialty comments available.  Imaging: No results found.   PMFS History: Patient Active Problem List   Diagnosis Date Noted   Pain in left wrist 01/16/2022   Insomnia 11/14/2021   Severe bipolar I disorder, most recent episode depressed (HCC) 11/14/2021   Closed fracture of left distal radius 06/06/2021   Unstable angina (HCC) 02/04/2021   Chest pain 02/04/2021   Past Medical History:  Diagnosis Date   Bipolar depression (HCC)    Thrombosed hemorrhoids     Family History  Problem Relation Age of Onset   Colon cancer Other        grandfather   Pancreatic cancer Other        aunt    Past  Surgical History:  Procedure Laterality Date   APPENDECTOMY     Social History   Occupational History   Not on file  Tobacco Use   Smoking status: Never   Smokeless tobacco: Never  Vaping Use   Vaping Use: Never used  Substance and Sexual Activity   Alcohol use: Not Currently   Drug use: Never   Sexual activity: Not on file

## 2022-01-16 NOTE — Addendum Note (Signed)
Addended by: Marlyne Beards on: 01/16/2022 09:49 AM   Modules accepted: Orders

## 2022-01-18 ENCOUNTER — Ambulatory Visit
Admission: RE | Admit: 2022-01-18 | Discharge: 2022-01-18 | Disposition: A | Discharge status: Home / Self Care | Payer: No Typology Code available for payment source | Source: Ambulatory Visit | Attending: Orthopedic Surgery | Admitting: Orthopedic Surgery

## 2022-01-18 DIAGNOSIS — M25532 Pain in left wrist: Secondary | ICD-10-CM

## 2022-03-30 ENCOUNTER — Ambulatory Visit: Payer: No Typology Code available for payment source | Admitting: Medical-Surgical

## 2022-05-07 ENCOUNTER — Encounter (HOSPITAL_BASED_OUTPATIENT_CLINIC_OR_DEPARTMENT_OTHER): Payer: Self-pay

## 2022-05-07 ENCOUNTER — Other Ambulatory Visit: Payer: Self-pay

## 2022-05-07 ENCOUNTER — Emergency Department (HOSPITAL_BASED_OUTPATIENT_CLINIC_OR_DEPARTMENT_OTHER): Payer: No Typology Code available for payment source | Admitting: Radiology

## 2022-05-07 ENCOUNTER — Emergency Department (HOSPITAL_BASED_OUTPATIENT_CLINIC_OR_DEPARTMENT_OTHER)
Admission: EM | Admit: 2022-05-07 | Discharge: 2022-05-07 | Disposition: A | Discharge status: Home / Self Care | Payer: No Typology Code available for payment source | Attending: Emergency Medicine | Admitting: Emergency Medicine

## 2022-05-07 DIAGNOSIS — M25572 Pain in left ankle and joints of left foot: Secondary | ICD-10-CM | POA: Insufficient documentation

## 2022-05-07 DIAGNOSIS — M25472 Effusion, left ankle: Secondary | ICD-10-CM | POA: Diagnosis not present

## 2022-05-07 NOTE — Discharge Instructions (Addendum)
Recommend activity as tolerated, ice, tylenol and ibuprofen.    For your pain, you may take up to 1000mg  of acetaminophen (tylenol) 4 times daily for up to a week. This is the maximum dose of acetaminophen (tylenol) you can take from all sources. Please check other over-the-counter medications and prescriptions to ensure you are not taking other medications that contain acetaminophen.  You may also take ibuprofen 400 mg 6 times a day OR 600mg  4 times a day alternating with or at the same time as tylenol.

## 2022-05-07 NOTE — ED Provider Notes (Signed)
[facility] EMERGENCY DEPT Provider Note   CSN: [id number] Arrival date & time: 05/07/22  6203     History No pertinent past medical history. Chief Complaint  Patient presents with   Ankle Pain    Brain Honeycutt is a 26 y.o. male.   Ankle Pain Associated symptoms: no fever      Patient endorses 1 week of pain at left ankle, pointing to left medial malleolus. 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 Hurts when he touches it and when he does anything athletic. Sharp stabbing pain. Pain will radiate up leg to mid shin and down to mid foot medially. Coach feels like it's saphenous nerve or tendonitis. Able to walk fine. Only hurts when running or skating. No gout history.  Home Medications Prior to Admission medications   Not on File      Allergies    Patient has no known allergies.    Review of Systems   Review of Systems  Constitutional:  Negative for fever.  Respiratory:  Negative for shortness of breath.   Cardiovascular:  Negative for chest pain and leg swelling.  Neurological:  Negative for weakness and numbness.    Physical Exam Updated Vital Signs BP 109/69 (BP Location: Right Arm)   Pulse 60   Temp 98.1 F (36.7 C) (Oral)   Resp 12   Ht 5\' 9"  (1.753 m)   Wt 59 kg   SpO2 99%   BMI 19.20 kg/m  Physical Exam Constitutional:      General: He is not in acute distress.    Appearance: He is not ill-appearing.  Musculoskeletal:     Comments: Mild edema about left medial malleolus. Full range of motion at left ankle. Tenderness to moderate palpation at left medial malleolus. Distal pulse intact. Strength and sensation intact. No tenderness proximal or distal to malleolus.  Neurological:     Mental Status: He is alert.     ED Results / Procedures / Treatments   Labs (all labs ordered are listed, but only abnormal results are displayed) Labs Reviewed - No data to display  EKG None  Radiology DG Ankle Complete Left  Result Date:  05/07/2022 CLINICAL DATA:  Ankle swelling. EXAM: LEFT ANKLE COMPLETE - 3+ VIEW COMPARISON:  None Available. FINDINGS: No signs of acute fracture or dislocation. Mild medial soft tissue swelling. No radiopaque foreign bodies. No significant arthropathy. IMPRESSION: Mild medial soft tissue swelling. No acute bone abnormality. Electronically Signed   By: 05/09/2022 M.D.   On: 05/07/2022 07:42    Procedures Procedures    Medications Ordered in ED Medications - No data to display  ED Course/ Medical Decision Making/ A&P                            Pain at left medial malleolus after mechanical stress with speed skating. Exam with mild edema, tenderness to moderate palpation, but neurovascularly intact. Left ankle XR ordered to rule out fracture. Symptoms do correspond to possible saphenous nerve compression. Differential also includes gout, bone spur, fracture, bursitis.  Personally reviewed XR and my interpretation is: mild edema about left medial malleolus, no evidence of fracture.  Will refer to sports medicine for further workup. Activity as tolerated. Recommended Tylenol for pain. Ambulates well, opted to forego crutches/brace.        Final Clinical Impression(s) / ED Diagnoses Final diagnoses:  Acute left ankle pain    Rx / DC Orders ED  Discharge Orders     None         Linward Natal, MD 05/07/22 Azzie Almas    Gareth Morgan, MD 05/08/22 [id number]

## 2022-05-07 NOTE — ED Triage Notes (Signed)
Pt states has had pain for a week in left ankle, states is related to sports injury. Red and swollen.

## 2022-05-09 ENCOUNTER — Ambulatory Visit (INDEPENDENT_AMBULATORY_CARE_PROVIDER_SITE_OTHER): Payer: No Typology Code available for payment source | Admitting: Orthopaedic Surgery

## 2022-05-09 DIAGNOSIS — G5782 Other specified mononeuropathies of left lower limb: Secondary | ICD-10-CM

## 2022-05-09 MED ORDER — LIDOCAINE HCL 1 % IJ SOLN
4.0000 mL | INTRAMUSCULAR | Status: AC | PRN
  Administered 2022-05-09: 4 mL

## 2022-05-09 MED ORDER — TRIAMCINOLONE ACETONIDE 40 MG/ML IJ SUSP
80.0000 mg | INTRAMUSCULAR | Status: AC | PRN
  Administered 2022-05-09: 80 mg via INTRA_ARTICULAR

## 2022-05-09 NOTE — Progress Notes (Addendum)
Chief Complaint: Right ankle pain     History of Present Illness:    Kevin [name] is a 26 y.o. male presents with shooting right ankle pain around the medial malleolus for the last several days.  He states that he has a CT Ater and possibly a tiny skin is too tight.  He is experiencing pain and a shooting type neuropathic pain down the ankle with any type of pressure.  This is very sore for him.  He is refrain from skating since this occurred.    Surgical History:   None  PMH/PSH/Family History/Social History/Meds/Allergies:    Past Medical History:  Diagnosis Date  . Bipolar depression ([place])   . Thrombosed hemorrhoids    Past Surgical History:  Procedure Laterality Date  . APPENDECTOMY     Social History   Socioeconomic History  . Marital status: Single    Spouse name: Not on file  . Number of children: Not on file  . Years of education: Not on file  . Highest education level: Not on file  Occupational History  . Not on file  Tobacco Use  . Smoking status: Never  . Smokeless tobacco: Never  Vaping Use  . Vaping Use: Never used  Substance and Sexual Activity  . Alcohol use: Not Currently  . Drug use: Never  . Sexual activity: Not on file  Other Topics Concern  . Not on file  Social History Narrative  . Not on file   Social Determinants of Health   Financial Resource Strain: Not on file  Food Insecurity: Not on file  Transportation Needs: Not on file  Physical Activity: Not on file  Stress: Not on file  Social Connections: Not on file   Family History  Problem Relation Age of Onset  . Colon cancer Other        grandfather  . Pancreatic cancer Other        aunt   No Known Allergies No current outpatient medications on file.   No current facility-administered medications for this visit.   No results found.  Review of Systems:   A ROS was performed including pertinent positives and negatives as documented in  the HPI.  Physical Exam :   Constitutional: NAD and appears stated age Neurological: Alert and oriented Psych: Appropriate affect and cooperative There were no vitals taken for this visit.   Comprehensive Musculoskeletal Exam:    Positive tenderness to palpation about medial malleolus with swelling.  Positive Tinel over the saphenous nerve at the medial malleolus.  Otherwise distal neurosensory exam intact with full equal range of motion  Imaging:   Xray (3 views left ankle): Normal   I personally reviewed and interpreted the radiographs.   Assessment:   26 y.o. male presents with left likely saphenous irritation from an over tight roller skate.  This time I recommended ultrasound-guided steroid around the saphenous nerve.  This was performed today.  I will plan to see him back in 2 weeks for recheck.  Plan :    -Left ankle ultrasound-guided steroid injection performed after verbal consent obtained    Procedure Note  Patient: Kevin [name]             Date of Birth: Oct 25, 1995           MRN: [id number]  Visit Date: 05/09/2022  Procedures: Visit Diagnoses:  1. Nerve entrapment of lower limb, left     Medium Joint Inj: L ankle on 05/09/2022 8:26 AM Indications: pain Details: 22 G 1.5 in needle, ultrasound-guided anterior approach Medications: 4 mL lidocaine 1 %; 80 mg triamcinolone acetonide 40 MG/ML Outcome: tolerated well, no immediate complications Immediately prior to procedure a time out was called to verify the correct patient, procedure, equipment, support staff and site/side marked as required. Patient was prepped and draped in the usual sterile fashion.        I personally saw and evaluated the patient, and participated in the management and treatment plan.  Huel Cote, MD Attending Physician, Orthopedic Surgery  This document was dictated using Dragon voice recognition software. A reasonable attempt at proof reading has been made to  minimize errors.

## 2022-05-21 ENCOUNTER — Ambulatory Visit (HOSPITAL_BASED_OUTPATIENT_CLINIC_OR_DEPARTMENT_OTHER): Payer: No Typology Code available for payment source | Admitting: Orthopaedic Surgery

## 2022-06-09 ENCOUNTER — Encounter (HOSPITAL_BASED_OUTPATIENT_CLINIC_OR_DEPARTMENT_OTHER): Payer: Self-pay | Admitting: Emergency Medicine

## 2022-06-09 ENCOUNTER — Emergency Department (HOSPITAL_BASED_OUTPATIENT_CLINIC_OR_DEPARTMENT_OTHER)
Admission: EM | Admit: 2022-06-09 | Discharge: 2022-06-09 | Disposition: A | Discharge status: Home / Self Care | Payer: No Typology Code available for payment source | Attending: Emergency Medicine | Admitting: Emergency Medicine

## 2022-06-09 ENCOUNTER — Emergency Department (HOSPITAL_BASED_OUTPATIENT_CLINIC_OR_DEPARTMENT_OTHER): Payer: No Typology Code available for payment source

## 2022-06-09 DIAGNOSIS — Y9351 Activity, roller skating (inline) and skateboarding: Secondary | ICD-10-CM | POA: Insufficient documentation

## 2022-06-09 DIAGNOSIS — S93401A Sprain of unspecified ligament of right ankle, initial encounter: Secondary | ICD-10-CM | POA: Insufficient documentation

## 2022-06-09 DIAGNOSIS — S99911A Unspecified injury of right ankle, initial encounter: Secondary | ICD-10-CM | POA: Diagnosis present

## 2022-06-09 NOTE — ED Provider Notes (Signed)
[facility] EMERGENCY DEPT Provider Note   CSN: [id number] Arrival date & time: 06/09/22  1213     History  Chief Complaint  Patient presents with   Ankle Pain    Kevin [name] is a 26 y.o. male.  Patient is a healthy 26 year old male presenting today with a right ankle injury.  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  He is injured his right ankle and is unable to bear weight.  He denies significant pain but feels that his foot is numb.  He is having difficulty flexing and pointing the foot as well.  No other injuries.  The history is provided by the patient.  Ankle Pain      Home Medications Prior to Admission medications   Not on File      Allergies    Aripiprazole    Review of Systems   Review of Systems  Physical Exam Updated Vital Signs BP 123/84 (BP Location: Right Arm)   Pulse 67   Temp 98.4 F (36.9 C) (Oral)   Resp 18   Ht 5\' 9"  (1.753 m)   Wt 59 kg   SpO2 100%   BMI 19.20 kg/m  Physical Exam Vitals and nursing note reviewed.  Constitutional:      General: He is not in acute distress. HENT:     Head: Normocephalic and atraumatic.  Cardiovascular:     Pulses: Normal pulses.  Musculoskeletal:        General: Swelling and signs of injury present.     Comments: Ecchymosis noted around the right ankle with swelling.  Difficulty with plantarflexion but is able to wiggle the toes without difficulty.  Mild tenderness with palpation over the medial and lateral malleolus.  Skin:    General: Skin is warm.     Capillary Refill: Capillary refill takes less than 2 seconds.  Neurological:     Mental Status: He is alert. Mental status is at baseline.     ED Results / Procedures / Treatments   Labs (all labs ordered are listed, but only abnormal results are displayed) Labs Reviewed - No data to display  EKG None  Radiology DG Ankle Complete Right  Result Date:  06/09/2022 CLINICAL DATA:  Right ankle pain status post trauma. EXAM: RIGHT ANKLE - COMPLETE 3+ VIEW COMPARISON:  None Available. FINDINGS: There is no evidence of fracture, dislocation, or joint effusion. There is no evidence of arthropathy or other focal bone abnormality. Mild soft tissue swelling at the anterior aspect of the ankle. IMPRESSION: No acute fracture or dislocation. Mild soft tissue swelling at the anterior aspect of the ankle. Electronically Signed   By: 06/11/2022 M.D.   On: 06/09/2022 13:17    Procedures Procedures    Medications Ordered in ED Medications - No data to display  ED Course/ Medical Decision Making/ A&P                           Medical Decision Making Amount and/or Complexity of Data Reviewed Radiology: ordered and independent interpretation performed. Decision-making details documented in ED Course.   Patient presenting today after an injury to his right ankle.  Concern for possible sprain versus fracture.  Vascularly intact with good pulse and capillary refill.  However decreased sensation and concern for possible nerve injury as well as ligamental injury.I have independently visualized and interpreted pt's  images today.  X-ray of the ankle is negative for acute fracture today.  Given patient's injury and mechanism feel that he could have ligamental injury.  He was placed in a brace and given crutches.  Given follow-up with orthopedics.  He has seen Dr. Steward Drone in the past and was referred back to him.         Final Clinical Impression(s) / ED Diagnoses Final diagnoses:  Sprain of right ankle, unspecified ligament, initial encounter    Rx / DC Orders ED Discharge Orders     None         Gwyneth Sprout, MD 06/09/22 1643

## 2022-06-09 NOTE — Discharge Instructions (Addendum)
Ice, elevate, tylenol and ibuprofen for pain.  No weight bearing until you see ortho.

## 2022-06-09 NOTE — ED Notes (Signed)
He is profane and calls me "an old entitled prick". I have someone else d/c him at this time.

## 2022-06-09 NOTE — ED Triage Notes (Signed)
Patient presents C/O R ankle pain onset just PTA. At speed skating practice and hit R ankle on wall.

## 2022-06-13 ENCOUNTER — Ambulatory Visit (INDEPENDENT_AMBULATORY_CARE_PROVIDER_SITE_OTHER): Payer: Self-pay | Admitting: Orthopaedic Surgery

## 2022-06-13 DIAGNOSIS — G5782 Other specified mononeuropathies of left lower limb: Secondary | ICD-10-CM

## 2022-06-13 NOTE — Progress Notes (Signed)
Chief Complaint: Right ankle pain     History of Present Illness:   06/13/2022: Presents today for follow-up of his right ankle.  He states that he twisted this when he did go against a wall for speed possibly skating.  Since that time he has had swelling but progressive ability to weight-bear on the right ankle.  Swelling is improved.  He is still experiencing left-sided ankle pain likely from neuroma.  Kevin [name] is a 26 y.o. male presents with shooting right ankle pain around the medial malleolus for the last several days.  He states that he has a CT Ater and possibly a tiny skin is too tight.  He is experiencing pain and a shooting type neuropathic pain down the ankle with any type of pressure.  This is very sore for him.  He is refrain from skating since this occurred.    Surgical History:   None  PMH/PSH/Family History/Social History/Meds/Allergies:    Past Medical History:  Diagnosis Date   Bipolar depression (HCC)    Thrombosed hemorrhoids    Past Surgical History:  Procedure Laterality Date   APPENDECTOMY     Social History   Socioeconomic History   Marital status: Single    Spouse name: Not on file   Number of children: Not on file   Years of education: Not on file   Highest education level: Not on file  Occupational History   Not on file  Tobacco Use   Smoking status: Never   Smokeless tobacco: Never  Vaping Use   Vaping Use: Never used  Substance and Sexual Activity   Alcohol use: Not Currently   Drug use: Never   Sexual activity: Not on file  Other Topics Concern   Not on file  Social History Narrative   Not on file   Social Determinants of Health   Financial Resource Strain: Not on file  Food Insecurity: Not on file  Transportation Needs: Not on file  Physical Activity: Not on file  Stress: Not on file  Social Connections: Not on file   Family History  Problem Relation Age of Onset   Colon cancer Other         grandfather   Pancreatic cancer Other        aunt   Allergies  Allergen Reactions   Aripiprazole Other (See Comments)   No current outpatient medications on file.   No current facility-administered medications for this visit.   No results found.  Review of Systems:   A ROS was performed including pertinent positives and negatives as documented in the HPI.  Physical Exam :   Constitutional: NAD and appears stated age Neurological: Alert and oriented Psych: Appropriate affect and cooperative There were no vitals taken for this visit.   Comprehensive Musculoskeletal Exam:    Positive tenderness to palpation about medial malleolus with swelling.  Positive Tinel over the saphenous nerve at the medial malleolus.  Otherwise distal neurosensory exam intact with full equal range of motion  Right ankle with tenderness to palpation about the AITFL.  There is minimal laxity with anterior drawer or calcaneal tilt  Imaging:   Xray (3 views left ankle): Normal   I personally reviewed and interpreted the radiographs.   Assessment:   26 y.o. male presents with a right ankle sprain which  is improving.  I have advised him on a brace that he can utilize underneath his gait.  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  Unfortunately he did not get any relief from an ultrasound-guided injection into that effect I do believe he would benefit from some Hydro dissection for which I will plan to refer him to Dr. Shon Baton.  Plan :    -Return to clinic as needed for right ankle sprain   I personally saw and evaluated the patient, and participated in the management and treatment plan.  Huel Cote, MD Attending Physician, Orthopedic Surgery  This document was dictated using Dragon voice recognition software. A reasonable attempt at proof reading has been made to minimize errors.

## 2022-06-19 ENCOUNTER — Ambulatory Visit: Payer: No Typology Code available for payment source | Admitting: Sports Medicine

## 2022-07-04 ENCOUNTER — Other Ambulatory Visit: Payer: Self-pay

## 2022-07-04 ENCOUNTER — Emergency Department (HOSPITAL_BASED_OUTPATIENT_CLINIC_OR_DEPARTMENT_OTHER): Payer: No Typology Code available for payment source

## 2022-07-04 ENCOUNTER — Encounter (HOSPITAL_BASED_OUTPATIENT_CLINIC_OR_DEPARTMENT_OTHER): Payer: Self-pay

## 2022-07-04 ENCOUNTER — Emergency Department (HOSPITAL_BASED_OUTPATIENT_CLINIC_OR_DEPARTMENT_OTHER)
Admission: EM | Admit: 2022-07-04 | Discharge: 2022-07-04 | Disposition: A | Discharge status: Home / Self Care | Payer: No Typology Code available for payment source | Attending: Emergency Medicine | Admitting: Emergency Medicine

## 2022-07-04 DIAGNOSIS — R1032 Left lower quadrant pain: Secondary | ICD-10-CM | POA: Insufficient documentation

## 2022-07-04 LAB — COMPREHENSIVE METABOLIC PANEL
ALT: 17 U/L (ref 0–44)
AST: 21 U/L (ref 15–41)
Albumin: 5.2 g/dL — ABNORMAL HIGH (ref 3.5–5.0)
Alkaline Phosphatase: 32 U/L — ABNORMAL LOW (ref 38–126)
Anion gap: 8 (ref 5–15)
BUN: 25 mg/dL — ABNORMAL HIGH (ref 6–20)
CO2: 29 mmol/L (ref 22–32)
Calcium: 10.4 mg/dL — ABNORMAL HIGH (ref 8.9–10.3)
Chloride: 101 mmol/L (ref 98–111)
Creatinine, Ser: 0.95 mg/dL (ref 0.61–1.24)
GFR, Estimated: >60 mL/min (ref >60)
Glucose, Bld: 106 mg/dL — ABNORMAL HIGH (ref 70–99)
Potassium: 3.7 mmol/L (ref 3.5–5.1)
Sodium: 138 mmol/L (ref 135–145)
Total Bilirubin: 0.9 mg/dL (ref 0.3–1.2)
Total Protein: 8.3 g/dL — ABNORMAL HIGH (ref 6.5–8.1)

## 2022-07-04 LAB — URINALYSIS, ROUTINE W REFLEX MICROSCOPIC
Bilirubin Urine: NEGATIVE
Glucose, UA: NEGATIVE mg/dL
Hgb urine dipstick: NEGATIVE
Ketones, ur: NEGATIVE mg/dL
Leukocytes,Ua: NEGATIVE
Nitrite: NEGATIVE
Protein, ur: NEGATIVE mg/dL
Specific Gravity, Urine: 1.006 (ref 1.005–1.030)
pH: 6 (ref 5.0–8.0)

## 2022-07-04 LAB — CBC
HCT: 47 % (ref 39.0–52.0)
Hemoglobin: 15.3 g/dL (ref 13.0–17.0)
MCH: 27.9 pg (ref 26.0–34.0)
MCHC: 32.6 g/dL (ref 30.0–36.0)
MCV: 85.8 fL (ref 80.0–100.0)
Platelets: 267 10*3/uL (ref 150–400)
RBC: 5.48 MIL/uL (ref 4.22–5.81)
RDW: 12.2 % (ref 11.5–15.5)
WBC: 4.7 10*3/uL (ref 4.0–10.5)
nRBC: 0 % (ref 0.0–0.2)

## 2022-07-04 LAB — LIPASE, BLOOD: Lipase: 41 U/L (ref 11–51)

## 2022-07-04 MED ORDER — ONDANSETRON HCL 4 MG PO TABS: 4.0000 mg | ORAL_TABLET | Freq: Four times a day (QID) | ORAL | 0 refills | Status: DC

## 2022-07-04 MED ORDER — IOHEXOL 300 MG/ML  SOLN
100.0000 mL | Freq: Once | INTRAMUSCULAR | Status: AC | PRN
  Administered 2022-07-04: 60 mL via INTRAVENOUS

## 2022-07-04 NOTE — ED Provider Notes (Signed)
[facility] EMERGENCY DEPT Provider Note   CSN: [id number] Arrival date & time: 07/04/22  9485     History  Chief Complaint  Patient presents with   Abdominal Pain    Kevin [name] is a 27 y.o. male.  Patient is a 27 year old male with a past medical history of bipolar disorder and appendectomy presenting to the emergency department with abdominal pain.  The patient states for the last 3 days he has had left lower quadrant abdominal pain.  He states that the pain is constant but is worse with coughing and changing positions.  He states he initially thought he was constipated and took stool softeners but has been having regular bowel movements without any improvement.  He denies any fevers or chills.  He has intermittent nausea but denies any vomiting.  He denies any dysuria or hematuria, abnormal penile discharge, testicular pain or swelling.  He states he is not sexually active.  The history is provided by the patient.  Abdominal Pain      Home Medications Prior to Admission medications   Medication Sig Start Date End Date Taking? Authorizing Provider  ondansetron (ZOFRAN) 4 MG tablet Take 1 tablet (4 mg total) by mouth every 6 (six) hours. 07/04/22  Yes Leanord Asal K, DO      Allergies    Aripiprazole    Review of Systems   Review of Systems  Gastrointestinal:  Positive for abdominal pain.    Physical Exam Updated Vital Signs BP 125/80   Pulse (!) 52   Temp (!) 97.5 F (36.4 C) (Oral)   Resp 12   Ht 5\' 9"  (1.753 m)   Wt 56.7 kg   SpO2 100%   BMI 18.46 kg/m  Physical Exam Vitals and nursing note reviewed.  Constitutional:      General: He is not in acute distress.    Appearance: He is well-developed.  HENT:     Head: Normocephalic and atraumatic.     Mouth/Throat:     Mouth: Mucous membranes are moist.     Pharynx: Oropharynx is clear.  Eyes:     Extraocular Movements: Extraocular movements intact.  Cardiovascular:     Rate and  Rhythm: Normal rate and regular rhythm.  Pulmonary:     Effort: Pulmonary effort is normal.     Breath sounds: Normal breath sounds.  Abdominal:     General: Abdomen is flat.     Palpations: Abdomen is soft.     Tenderness: There is abdominal tenderness (Mild) in the left lower quadrant. There is no guarding or rebound.     Hernia: No hernia is present.  Skin:    General: Skin is warm and dry.     Findings: No rash.  Neurological:     General: No focal deficit present.     Mental Status: He is alert and oriented to person, place, and time.  Psychiatric:        Mood and Affect: Mood normal.        Behavior: Behavior normal.     ED Results / Procedures / Treatments   Labs (all labs ordered are listed, but only abnormal results are displayed) Labs Reviewed  COMPREHENSIVE METABOLIC PANEL - Abnormal; Notable for the following components:      Result Value   Glucose, Bld 106 (*)    BUN 25 (*)    Calcium 10.4 (*)    Total Protein 8.3 (*)    Albumin 5.2 (*)    Alkaline Phosphatase  32 (*)    All other components within normal limits  URINALYSIS, ROUTINE W REFLEX MICROSCOPIC - Abnormal; Notable for the following components:   Color, Urine COLORLESS (*)    All other components within normal limits  LIPASE, BLOOD  CBC    EKG None  Radiology CT ABDOMEN PELVIS W CONTRAST  Result Date: 07/04/2022 CLINICAL DATA:  3 day history of left lower quadrant pain. EXAM: CT ABDOMEN AND PELVIS WITH CONTRAST TECHNIQUE: Multidetector CT imaging of the abdomen and pelvis was performed using the standard protocol following bolus administration of intravenous contrast. RADIATION DOSE REDUCTION: This exam was performed according to the departmental dose-optimization program which includes automated exposure control, adjustment of the mA and/or kV according to patient size and/or use of iterative reconstruction technique. CONTRAST:  51mL OMNIPAQUE IOHEXOL 300 MG/ML  SOLN COMPARISON:  None Available.  FINDINGS: Lower chest: Unremarkable Hepatobiliary: No suspicious focal abnormality within the liver parenchyma. There is no evidence for gallstones, gallbladder wall thickening, or pericholecystic fluid. No intrahepatic or extrahepatic biliary dilation. Pancreas: No focal mass lesion. No dilatation of the main duct. No intraparenchymal cyst. No peripancreatic edema. Spleen: No splenomegaly. No focal mass lesion. Adrenals/Urinary Tract: No adrenal nodule or mass. Kidneys unremarkable. No evidence for hydroureter. The urinary bladder appears normal for the degree of distention. Stomach/Bowel: Stomach is unremarkable. No gastric wall thickening. No evidence of outlet obstruction. Duodenum is normally positioned as is the ligament of Treitz. No small bowel wall thickening. No small bowel dilatation. Nonvisualization of the appendix is consistent with the reported history of appendectomy. No gross colonic mass. No colonic wall thickening. No substantial stool volume to suggest clinical constipation. Vascular/Lymphatic: No abdominal aortic aneurysm. No abdominal aortic atherosclerotic calcification. There is no gastrohepatic or hepatoduodenal ligament lymphadenopathy. No retroperitoneal or mesenteric lymphadenopathy. No pelvic sidewall lymphadenopathy. Reproductive: The prostate gland and seminal vesicles are unremarkable. Other: No intraperitoneal free fluid. Musculoskeletal: No worrisome lytic or sclerotic osseous abnormality. IMPRESSION: No acute findings in the abdomen or pelvis. Specifically, no findings to explain the patient's history of left lower quadrant pain. Electronically Signed   By: Misty Stanley M.D.   On: 07/04/2022 05:23    Procedures Procedures    Medications Ordered in ED Medications  iohexol (OMNIPAQUE) 300 MG/ML solution 100 mL (60 mLs Intravenous Contrast Given 07/04/22 0500)    ED Course/ Medical Decision Making/ A&P                             Medical Decision Making This patient  presents to the ED with chief complaint(s) of abdominal pain with pertinent past medical history of bipolar disorder, appendectomy which further complicates the presenting complaint. The complaint involves an extensive differential diagnosis and also carries with it a high risk of complications and morbidity.    The differential diagnosis includes diverticulosis, diverticulitis, constipation, dehydration, electrolyte abnormality, UTI unlikely as patient has no urinary symptoms, no palpable hernia on exam, no visible rash making shingles unlikely, muscle strain  Additional history obtained: Additional history obtained from N/A Records reviewed N/A  ED Course and Reassessment: Patient was initially evaluated in triage and had labs, urine and CT abdomen and pelvis performed.  Labs are within normal range and CT showed no acute disease to explain his symptoms.  He had no rash or hernia visualized on exam and patient's pain is currently well-controlled.  Considering possible muscle strain and told him to continue Tylenol, Motrin and warm compresses.  He was recommended close primary care follow-up and was given strict return precautions.  Independent labs interpretation:  The following labs were independently interpreted: Within normal range  Independent visualization of imaging: - I independently visualized the following imaging with scope of interpretation limited to determining acute life threatening conditions related to emergency care: CT AP, which revealed no acute disease  Consultation: - Consulted or discussed management/test interpretation w/ external professional: N/A  Consideration for admission or further workup: Patient has no emergent conditions requiring admission or further work-up at this time and is stable for discharge home with primary care follow-up  Social Determinants of health: N/A    Amount and/or Complexity of Data Reviewed Labs: ordered.          Final  Clinical Impression(s) / ED Diagnoses Final diagnoses:  LLQ abdominal pain    Rx / DC Orders ED Discharge Orders          Ordered    ondansetron (ZOFRAN) 4 MG tablet  Every 6 hours        07/04/22 0751              Rexford Maus, DO 07/04/22 [phone]

## 2022-07-04 NOTE — ED Triage Notes (Addendum)
Patient arrives to ED POV c/o LLQ pain x3 days. Pt states pain does not radiate but it is  tenders to touch. Pt states he thought it was constipation and has been taking laxities to help but has been having regular BM. Denies blood in urine, denies vomiting only nausea. Also c/o of loss of appetite and weakness. No other complaints at this time. Pt A/O x4.

## 2022-07-04 NOTE — Discharge Instructions (Signed)
You were seen in the emergency department for your abdominal pain.  Your workup showed no signs of infection, hernia or obvious cause for your pain.  You can take Tylenol, Motrin and use heat to help with the pain.  You can take Zofran as needed.  You should follow-up with your primary doctor in the next few days to have your symptoms rechecked.  You should return to the emergency department for significantly worsening pain, repetitive vomiting despite the nausea medication, fevers or if you have any other new or concerning symptoms.

## 2022-09-08 ENCOUNTER — Emergency Department (HOSPITAL_BASED_OUTPATIENT_CLINIC_OR_DEPARTMENT_OTHER): Payer: No Typology Code available for payment source

## 2022-09-08 ENCOUNTER — Encounter (HOSPITAL_BASED_OUTPATIENT_CLINIC_OR_DEPARTMENT_OTHER): Payer: Self-pay

## 2022-09-08 ENCOUNTER — Emergency Department (HOSPITAL_BASED_OUTPATIENT_CLINIC_OR_DEPARTMENT_OTHER)
Admission: EM | Admit: 2022-09-08 | Discharge: 2022-09-08 | Disposition: A | Discharge status: Home / Self Care | Payer: No Typology Code available for payment source | Attending: Emergency Medicine | Admitting: Emergency Medicine

## 2022-09-08 DIAGNOSIS — Y9351 Activity, roller skating (inline) and skateboarding: Secondary | ICD-10-CM | POA: Insufficient documentation

## 2022-09-08 DIAGNOSIS — S060X0A Concussion without loss of consciousness, initial encounter: Secondary | ICD-10-CM | POA: Diagnosis not present

## 2022-09-08 DIAGNOSIS — S0990XA Unspecified injury of head, initial encounter: Secondary | ICD-10-CM | POA: Diagnosis present

## 2022-09-08 DIAGNOSIS — S060XAA Concussion with loss of consciousness status unknown, initial encounter: Secondary | ICD-10-CM

## 2022-09-08 MED ORDER — KETOROLAC TROMETHAMINE 30 MG/ML IJ SOLN
30.0000 mg | Freq: Once | INTRAMUSCULAR | Status: AC
  Administered 2022-09-08: 30 mg via INTRAMUSCULAR
  Filled 2022-09-08: qty 1

## 2022-09-08 MED ORDER — ONDANSETRON 4 MG PO TBDP
4.0000 mg | ORAL_TABLET | Freq: Once | ORAL | Status: AC
  Administered 2022-09-08: 4 mg via ORAL
  Filled 2022-09-08: qty 1

## 2022-09-08 NOTE — Discharge Instructions (Signed)
You were seen in the Emergency Department (ED) today for a head injury.  Based on your evaluation, you may have sustained a concussion (or bruise) to your brain.  If you had a CT scan done, it did not show any evidence of serious injury or bleeding.   ° °Symptoms to expect from a concussion include nausea, mild to moderate headache, difficulty concentrating or sleeping, and mild lightheadedness.  These symptoms should improve over the next few days to weeks, but it may take many weeks before you feel back to normal.  Return to the emergency department or follow-up with your primary care doctor if your symptoms are not improving over this time. ° °Signs of a more serious head injury include vomiting, severe headache, excessive sleepiness or confusion, and weakness or numbness in your face, arms or legs.  Return immediately to the Emergency Department if you experience any of these more concerning symptoms.   ° °Rest, avoid strenuous physical or mental activity, and avoid activities that could potentially result in another head injury until all your symptoms from this head injury are completely resolved for at least 2-3 weeks.  You may take ibuprofen or acetaminophen over the counter according to label instructions for mild headache or scalp soreness. ° °

## 2022-09-08 NOTE — ED Triage Notes (Signed)
Pt presents POV from home  Pt reports falling yesterday while at speed skating practice. Pt reports having a headache prior to practice but the pain intensified after the fall. He set down and took Excedrin with some relief.  Pt reports feeling confused, forgot where he was going this morning. Pt reports he was going approx 22 mph when he hit his head. Pt feels his pupils are smaller than normal. Pt is here for possible concussion. Pt states "I feel off" pt is having trouble finding his words   [place] 09/07/22

## 2022-09-08 NOTE — ED Provider Notes (Signed)
Emergency Department Provider Note   I have reviewed the triage vital signs and the nursing notes.   HISTORY  Chief Complaint Dizziness and Head Injury   HPI Kevin [name] is a 27 y.o. male with past history reviewed below presents to the emergency department for evaluation after head injury.  He has a history of migraines and before skating was developing a mild headache but during his speed skating practice he fell striking his head on the floor.  He did not strike anyone else or another object.  He believes there may have been some brief loss of consciousness and since that time he has had bad headache along with nausea and photophobia.  No vomiting.  No fevers or chills.  No numbness or weakness in the arms or legs.  No neck or back pain.   Past Medical History:  Diagnosis Date   Bipolar depression ([place])    Thrombosed hemorrhoids     Review of Systems  Constitutional: No fever/chills Eyes: Positive photophobia.  ENT: No sore throat. Cardiovascular: Denies chest pain. Respiratory: Denies shortness of breath. Gastrointestinal: No abdominal pain. Positive nausea, no vomiting.  No diarrhea.  No constipation. Genitourinary: Negative for dysuria. Musculoskeletal: Negative for back pain. Skin: Negative for rash. Neurological: Positive HA.   ____________________________________________   PHYSICAL EXAM:  VITAL SIGNS: ED Triage Vitals  Enc Vitals Group     BP 09/08/22 1345 112/74     Pulse Rate 09/08/22 1345 85     Resp 09/08/22 1345 18     Temp 09/08/22 1345 97.8 F (36.6 C)     Temp src --      SpO2 09/08/22 1345 98 %   Constitutional: Alert and oriented. Well appearing and in no acute distress. Eyes: Conjunctivae are normal.  Head: Atraumatic. Nose: No congestion/rhinnorhea. Mouth/Throat: Mucous membranes are moist.  Neck: No stridor.  Cardiovascular: Normal rate, regular rhythm. Good peripheral circulation. Grossly normal heart sounds.   Respiratory:  Normal respiratory effort.  No retractions. Lungs CTAB. Gastrointestinal: Soft and nontender. No distention.  Musculoskeletal: No lower extremity tenderness nor edema. No gross deformities of extremities. Neurologic:  Normal speech and language. No gross focal neurologic deficits are appreciated.  Skin:  Skin is warm, dry and intact. No rash noted. ____________________________________________  RADIOLOGY  CT Head Wo Contrast  Result Date: 09/08/2022 CLINICAL DATA:  Fall while at speeds getting practice with head and neck pain. EXAM: CT HEAD WITHOUT CONTRAST CT CERVICAL SPINE WITHOUT CONTRAST TECHNIQUE: Multidetector CT imaging of the head and cervical spine was performed following the standard protocol without intravenous contrast. Multiplanar CT image reconstructions of the cervical spine were also generated. RADIATION DOSE REDUCTION: This exam was performed according to the departmental dose-optimization program which includes automated exposure control, adjustment of the mA and/or kV according to patient size and/or use of iterative reconstruction technique. COMPARISON:  CT abdomen pelvis dated 07/04/2022, chest radiograph dated 02/09/2021. FINDINGS: CT HEAD FINDINGS Brain: No evidence of acute infarction, hemorrhage, hydrocephalus, extra-axial collection or mass lesion/mass effect. Vascular: No hyperdense vessel or unexpected calcification. Skull: Normal. Negative for fracture or focal lesion. Sinuses/Orbits: No acute finding. Other: None. CT CERVICAL SPINE FINDINGS Alignment: Normal. Skull base and vertebrae: No acute fracture. No primary bone lesion or focal pathologic process. Soft tissues and spinal canal: No prevertebral fluid or swelling. No visible canal hematoma. Disc levels:  Preserved. Upper chest: A partially imaged calcified pulmonary nodule is seen in the left upper lobe. Other: None. IMPRESSION: 1. No acute intracranial  abnormality. 2. No acute fracture or subluxation of the cervical  spine. 3. Partially imaged calcified pulmonary nodule in the left upper lobe likely reflects healed infection/granulomatous disease. Electronically Signed   By: Zerita Boers M.D.   On: 09/08/2022 14:37   CT Cervical Spine Wo Contrast  Result Date: 09/08/2022 CLINICAL DATA:  Fall while at speeds getting practice with head and neck pain. EXAM: CT HEAD WITHOUT CONTRAST CT CERVICAL SPINE WITHOUT CONTRAST TECHNIQUE: Multidetector CT imaging of the head and cervical spine was performed following the standard protocol without intravenous contrast. Multiplanar CT image reconstructions of the cervical spine were also generated. RADIATION DOSE REDUCTION: This exam was performed according to the departmental dose-optimization program which includes automated exposure control, adjustment of the mA and/or kV according to patient size and/or use of iterative reconstruction technique. COMPARISON:  CT abdomen pelvis dated 07/04/2022, chest radiograph dated 02/09/2021. FINDINGS: CT HEAD FINDINGS Brain: No evidence of acute infarction, hemorrhage, hydrocephalus, extra-axial collection or mass lesion/mass effect. Vascular: No hyperdense vessel or unexpected calcification. Skull: Normal. Negative for fracture or focal lesion. Sinuses/Orbits: No acute finding. Other: None. CT CERVICAL SPINE FINDINGS Alignment: Normal. Skull base and vertebrae: No acute fracture. No primary bone lesion or focal pathologic process. Soft tissues and spinal canal: No prevertebral fluid or swelling. No visible canal hematoma. Disc levels:  Preserved. Upper chest: A partially imaged calcified pulmonary nodule is seen in the left upper lobe. Other: None. IMPRESSION: 1. No acute intracranial abnormality. 2. No acute fracture or subluxation of the cervical spine. 3. Partially imaged calcified pulmonary nodule in the left upper lobe likely reflects healed infection/granulomatous disease. Electronically Signed   By: Zerita Boers M.D.   On: 09/08/2022 14:37     ____________________________________________   PROCEDURES  Procedure(s) performed:   Procedures  None  ____________________________________________   INITIAL IMPRESSION / ASSESSMENT AND PLAN / ED COURSE  Pertinent labs & imaging results that were available during my care of the patient were reviewed by me and considered in my medical decision making (see chart for details).   This patient is Presenting for Evaluation of head injury, which does require a range of treatment options, and is a complaint that involves a high risk of morbidity and mortality.  The Differential Diagnoses includes subdural hematoma, epidural hematoma, acute concussion, traumatic subarachnoid hemorrhage, cerebral contusions, etc.   Critical Interventions-    Medications  ketorolac (TORADOL) 30 MG/ML injection 30 mg (30 mg Intramuscular Given 09/08/22 1504)  ondansetron (ZOFRAN-ODT) disintegrating tablet 4 mg (4 mg Oral Given 09/08/22 1504)    Reassessment after intervention: Symptoms improved.    Radiologic Tests Ordered, included CT head and c spine. I independently interpreted the images and agree with radiology interpretation.   Medical Decision Making: Summary:  Patient presents emergency department for evaluation after head injury.  He is having some headache, photophobia, nausea.  May be consistent with his migraine headache versus posttraumatic headache versus concussion.  Need to rule out intracranial hemorrhage or other fracture which I have low suspicion for.  Reevaluation with update and discussion with patient.  CT imaging of the head and neck are reassuring.  Symptoms improved with medication given in the ED.  Provided contact information for sports medicine for concussion clearance prior to returning to skating.    Patient's presentation is most consistent with acute, uncomplicated illness.   Disposition: discharge  ____________________________________________  FINAL CLINICAL  IMPRESSION(S) / ED DIAGNOSES  Final diagnoses:  Injury of head, initial encounter  Concussion with unknown  loss of consciousness status, initial encounter    Note:  This document was prepared using Dragon voice recognition software and may include unintentional dictation errors.  Nanda Quinton, MD, [facility] Emergency Medicine    Akayla Brass, [place], MD 09/09/22 [phone]

## 2022-09-28 ENCOUNTER — Institutional Professional Consult (permissible substitution): Payer: No Typology Code available for payment source | Admitting: Emergency Medicine

## 2022-10-03 ENCOUNTER — Telehealth: Payer: Self-pay | Admitting: Pulmonary Disease

## 2022-10-03 NOTE — Telephone Encounter (Signed)
Completed "Self Referral Questionnaire" and put in Dr Kary Kos box.

## 2022-10-03 NOTE — Telephone Encounter (Signed)
Kevin [name] just an Fyi since your with Dr. Val Eagle tomorrow

## 2022-10-08 NOTE — Telephone Encounter (Signed)
Received self referral for patient . Will close encounter.Nothing else further needed.

## 2022-10-18 ENCOUNTER — Institutional Professional Consult (permissible substitution): Payer: Self-pay | Admitting: Pulmonary Disease

## 2022-11-02 ENCOUNTER — Encounter: Payer: Self-pay | Admitting: Pulmonary Disease

## 2022-11-14 NOTE — Progress Notes (Signed)
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        History of Present Illness  11/15/2022  Pulmonary/ 1st office eval/Kevin [name]  Chief Complaint  Patient presents with   Consult    Calcified nodule found on MRI upper L lung.  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   Dyspnea:  still able to speed skate up to 5 min competitively  Cough: comes and goes / not productive x sev years  Sleep: no problem at 30 degrees  SABA use: none   No obvious day to day or daytime pattern/variability or assoc excess/ purulent sputum or mucus plugs or hemoptysis or cp or chest tightness, subjective wheeze or overt sinus or hb symptoms.   Sleeping  without nocturnal  or early am exacerbation  of respiratory  c/o's or need for noct saba. Also denies any obvious fluctuation of symptoms with weather or environmental changes or other aggravating or alleviating factors except as outlined above   No unusual exposure hx or h/o childhood pna/ asthma or knowledge of premature birth.  Current Allergies, Complete Past Medical History, Past Surgical History, Family History, and Social History were reviewed in [employer] record.  ROS  The following are not active complaints unless bolded Hoarseness, sore throat, dysphagia, dental problems, itching, sneezing,  nasal congestion or discharge of excess mucus or purulent secretions, ear ache,   fever, chills, sweats, unintended wt loss or wt gain, classically pleuritic or exertional cp,  orthopnea pnd or arm/hand swelling  or leg swelling, presyncope, palpitations, abdominal pain, anorexia, nausea, vomiting, diarrhea  or change in bowel habits or change in bladder habits, change in stools or change  in urine, dysuria, hematuria,  rash, arthralgias, visual complaints, headache, numbness, weakness or ataxia or problems with walking or coordination,  change in mood/ anxious or  memory.           Past Medical History:  Diagnosis Date   Bipolar depression (HCC)    Thrombosed hemorrhoids     Outpatient Medications Prior to Visit  Medication Sig Dispense Refill   Cholecalciferol 100 MCG (4000 UT) TABS TAKE TWO TABLETS BY MOUTH ONCE A DAY FOR VITAMIN D SUPPLEMENTATION     cyanocobalamin (VITAMIN B12) 500 MCG tablet Take 500 mcg by mouth daily.     lamoTRIgine (LAMICTAL) 25 MG tablet Take 75 mg by mouth daily.     ondansetron (ZOFRAN) 4 MG tablet Take 1 tablet (4 mg total) by mouth every 6 (six) hours. 12 tablet 0   No facility-administered medications prior to visit.     Objective:     BP 102/62 (BP Location: Left Arm, Patient Position: Sitting, Cuff Size: Normal)   Pulse (!) 52   Temp 97.9 F (36.6 C) (Oral)   Ht 5\' 9"  (1.753 m)   Wt 144 lb 6.6 oz (65.5 kg)   SpO2 100%   BMI 21.33 kg/m   SpO2: 100 %  Pleasant amb wm nad    HEENT : Oropharynx  clear      Nasal turbinates min edema/ no polyps    NECK :  without  apparent JVD/ palpable Nodes/TM    LUNGS: no acc muscle use,  Nl contour  chest which is clear to A and P bilaterally without cough on insp or exp maneuvers   CV:  RRR  no s3 or murmur or increase in P2, and no edema   ABD:  soft and nontender with nl inspiratory excursion in the supine position. No bruits or organomegaly appreciated   MS:  Nl gait/ ext warm without deformities Or obvious joint restrictions  calf tenderness, cyanosis or clubbing    SKIN: warm and dry without lesions    NEURO:  alert, approp, nl sensorium with  no motor or cerebellar deficits apparent.     CXR PA and Lateral:   11/15/2022 :    I personally reviewed images and agree with radiology impression as follows:    The partially imaged, partially calcified left upper lobe  pulmonary nodule identified on recent CT imaging of the chest is poorly evaluated on this study, largely obscured by overlapping ribs. Given the patient's age in partial calcification, the findings are likely from a previous granulomatous inflammation      Assessment   Solitary pulmonary nodule 1st noted incidental finding on CT neck s/p trauma  09/08/22  - cxr 11/15/2022 wnl x for partially viz calcied nodule overlying rib   Agree with radiology re dx of post inflammary granulomatous lesion though no obvious h/o histo exposure.  In this otherwise healthy never smoker, I don't believe further studies are needed and pulmonary f/u can be prn          Each maintenance medication was reviewed in detail including emphasizing most importantly the difference between maintenance and prns and under what circumstances the prns are to be triggered using an action plan format where appropriate.  Total time for H and P, chart review, counseling,   and generating customized AVS unique to this office visit / same day charting = 30 min new pt eval           Sandrea Hughs, MD 11/15/2022

## 2022-11-15 ENCOUNTER — Ambulatory Visit (INDEPENDENT_AMBULATORY_CARE_PROVIDER_SITE_OTHER): Payer: Self-pay

## 2022-11-15 ENCOUNTER — Ambulatory Visit (INDEPENDENT_AMBULATORY_CARE_PROVIDER_SITE_OTHER): Payer: Self-pay | Admitting: Internal Medicine

## 2022-11-15 ENCOUNTER — Encounter: Payer: Self-pay | Admitting: Internal Medicine

## 2022-11-15 VITALS — BP 102/62 | HR 52 | Temp 97.9°F | Ht 69.0 in | Wt 144.4 lb

## 2022-11-15 DIAGNOSIS — R911 Solitary pulmonary nodule: Secondary | ICD-10-CM

## 2022-11-15 NOTE — Patient Instructions (Signed)
Please remember to go to the  x-ray department  for your tests - we will call you with the results when they are available    Pulmonary follow up is as needed 

## 2022-11-17 NOTE — Assessment & Plan Note (Signed)
1st noted incidental finding on CT neck s/p trauma  09/08/22  - cxr 11/15/2022 wnl x for partially viz calcied nodule overlying rib   Agree with radiology re dx of post inflammary granulomatous lesion though no obvious h/o histo exposure.  In this otherwise healthy never smoker, I don't believe further studies are needed and pulmonary f/u can be prn          Each maintenance medication was reviewed in detail including emphasizing most importantly the difference between maintenance and prns and under what circumstances the prns are to be triggered using an action plan format where appropriate.  Total time for H and P, chart review, counseling,   and generating customized AVS unique to this office visit / same day charting = 30 min new pt eval

## 2022-12-13 IMAGING — US US ABDOMEN LIMITED RUQ/ASCITES
1 series · 14 of 25 positions shown · non-contrast
Comparison: None.

CLINICAL DATA: Right upper quadrant pain for 2 months sharp pain
last night, nausea. Prior appendectomy

EXAM:
ULTRASOUND ABDOMEN LIMITED RIGHT UPPER QUADRANT

[Series 1: us abdomen limited ruq (liver/gb) · 14 of 62 slices shown]
[im 1/62]
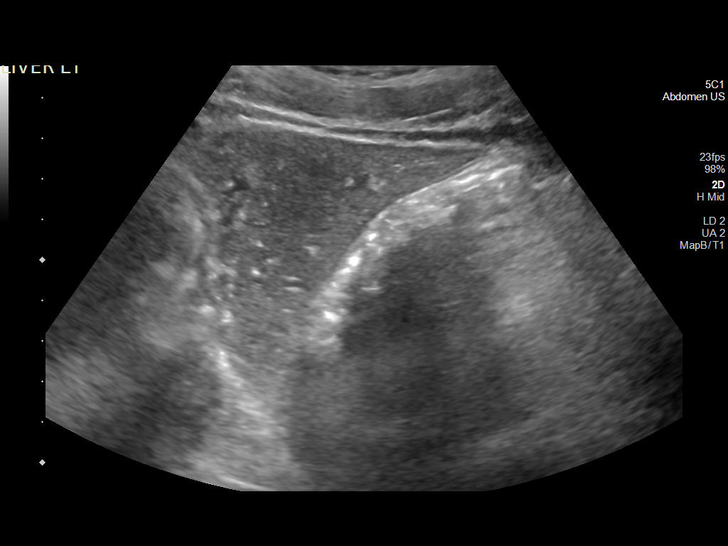
[im 6/62]
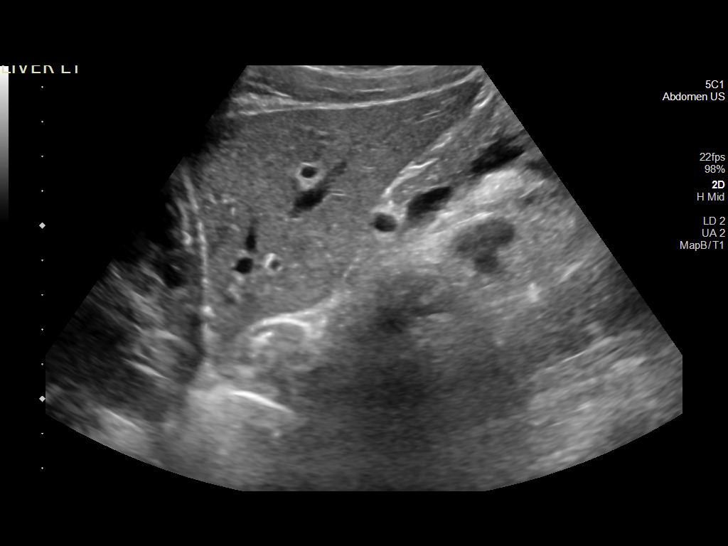
[im 11/62]
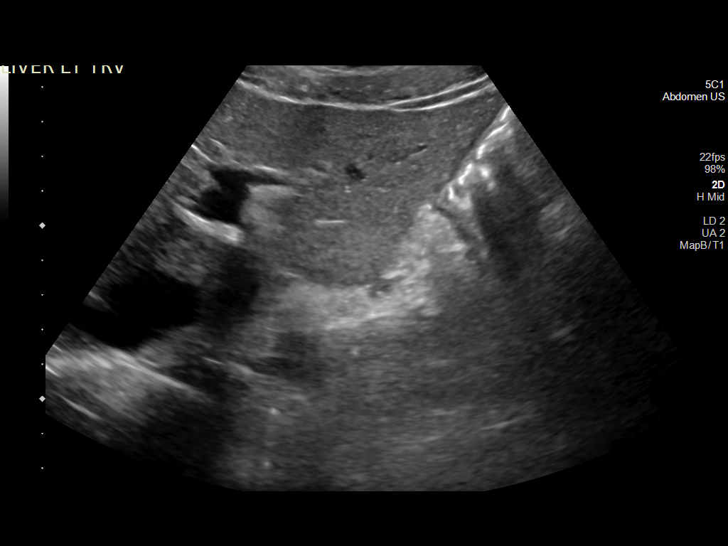
[im 16/62]
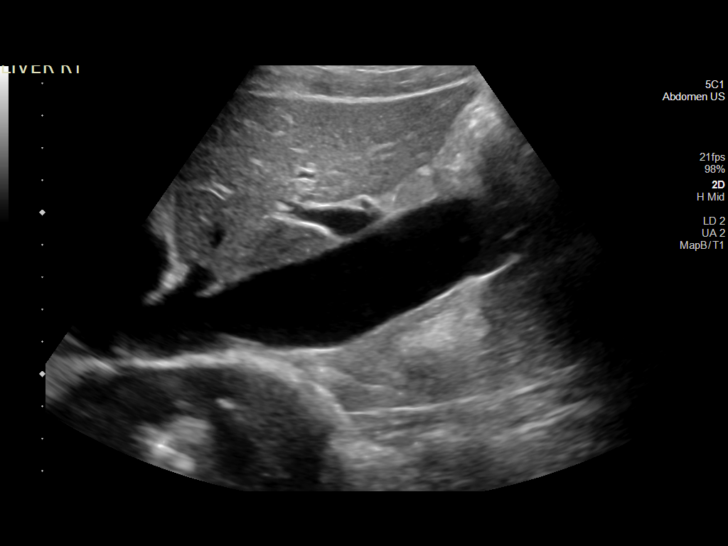
[im 21/62]
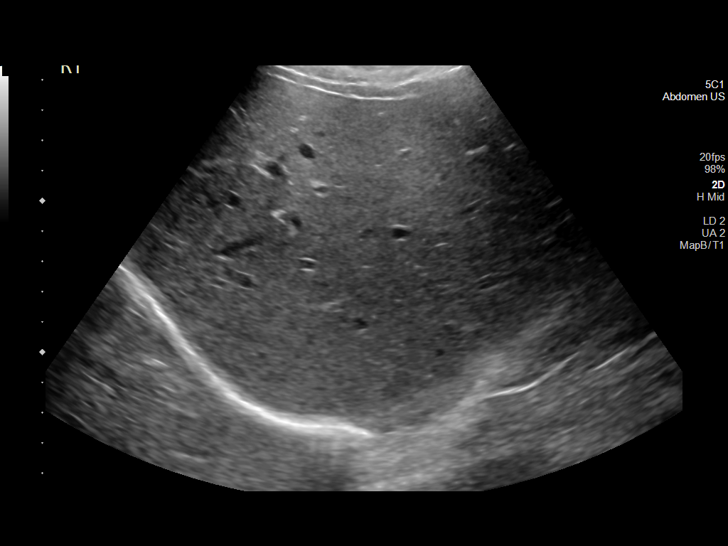
[im 23/62]
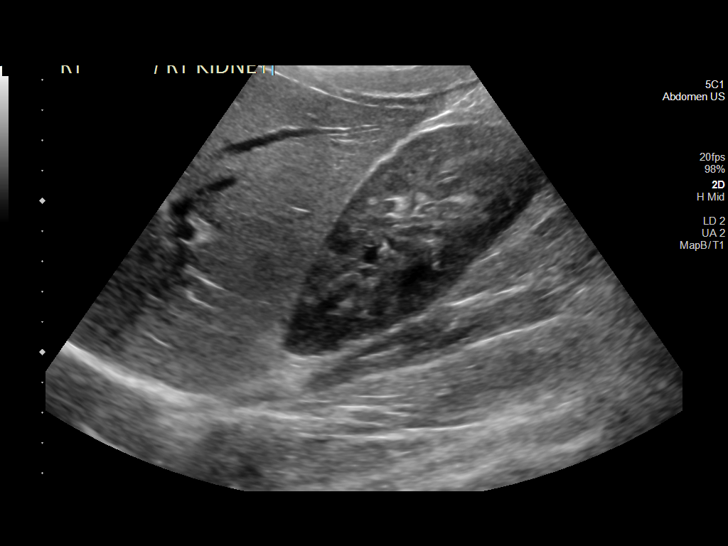
[im 28/62]
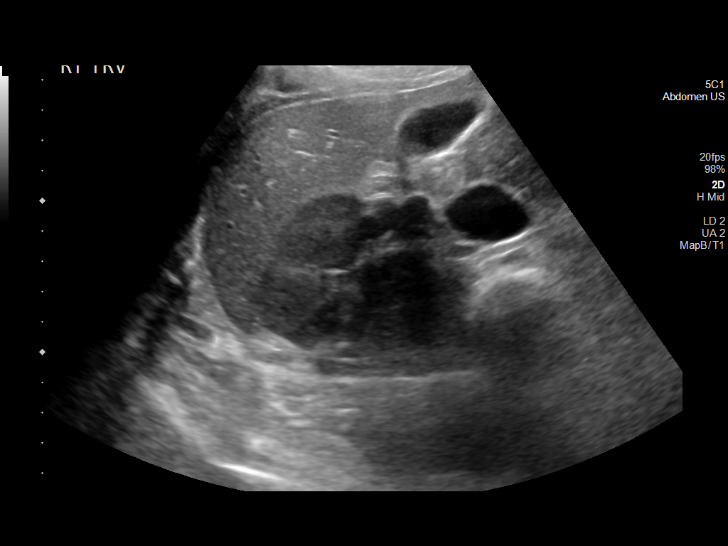
[im 34/62]
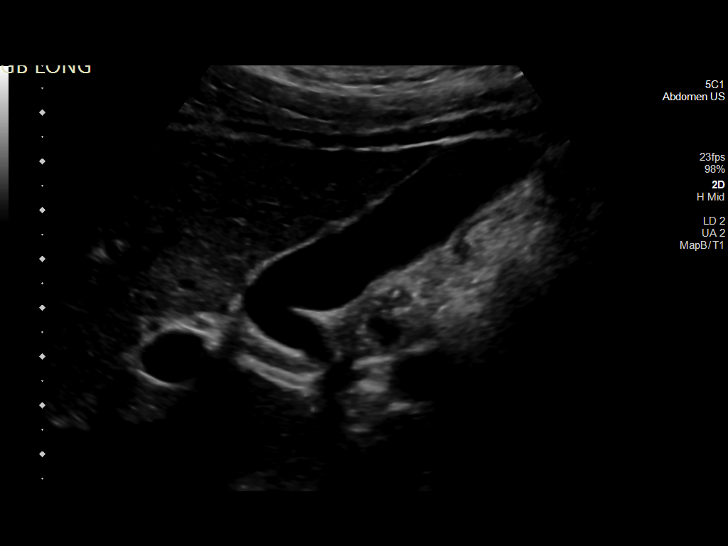
[im 39/62]
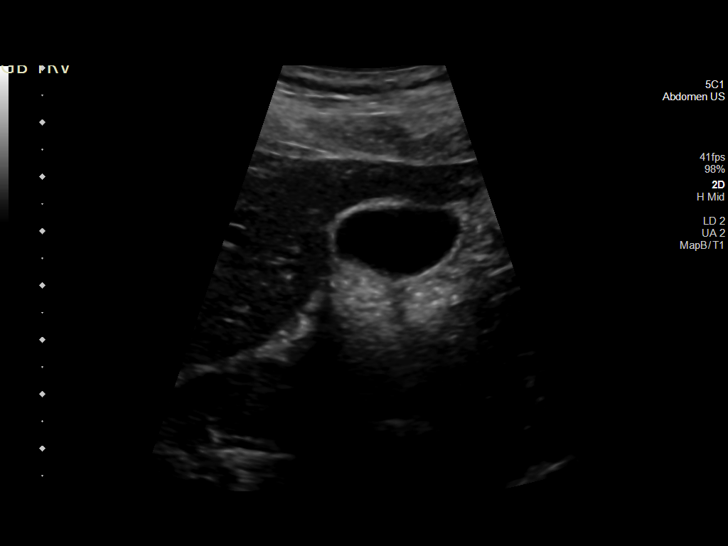
[im 41/62]
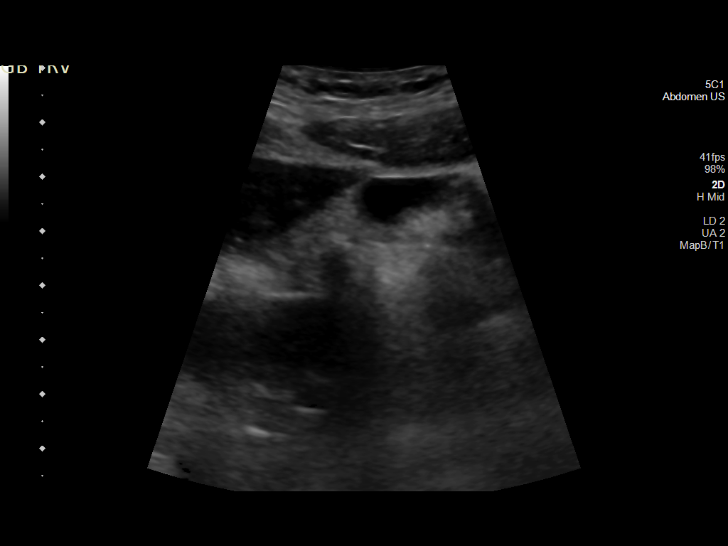
[im 46/62]
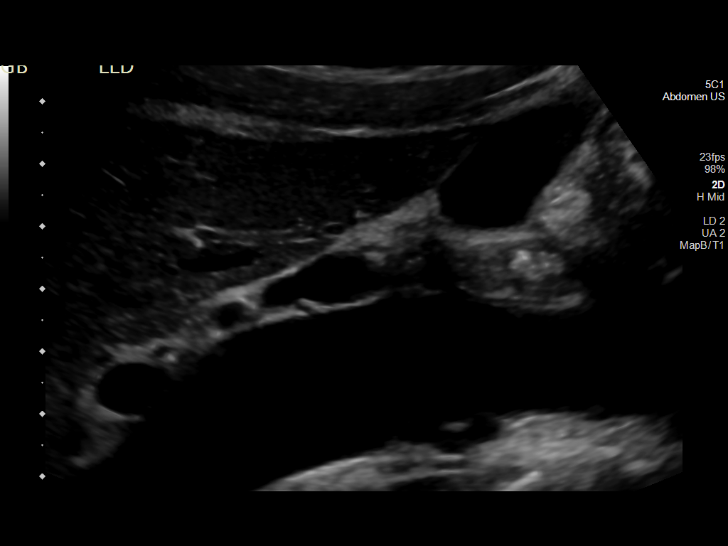
[im 51/62]
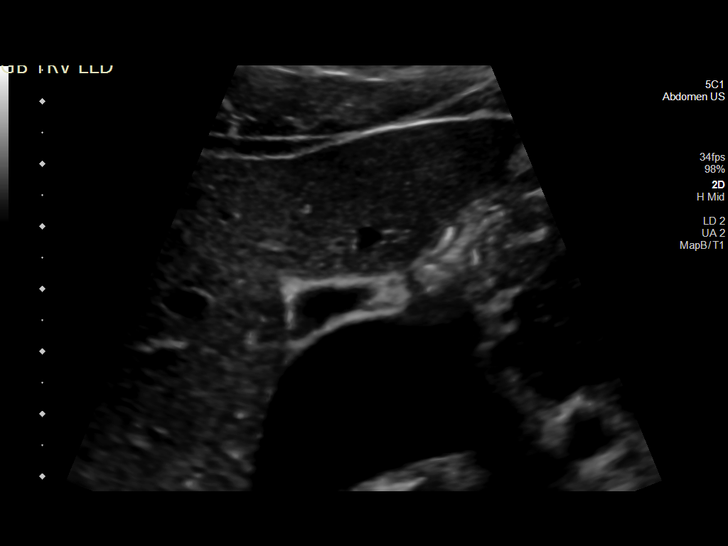
[im 56/62]
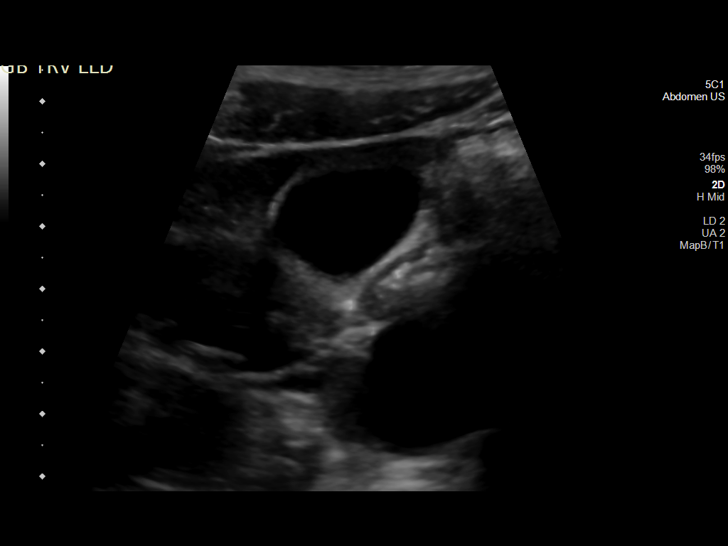
[im 62/62]
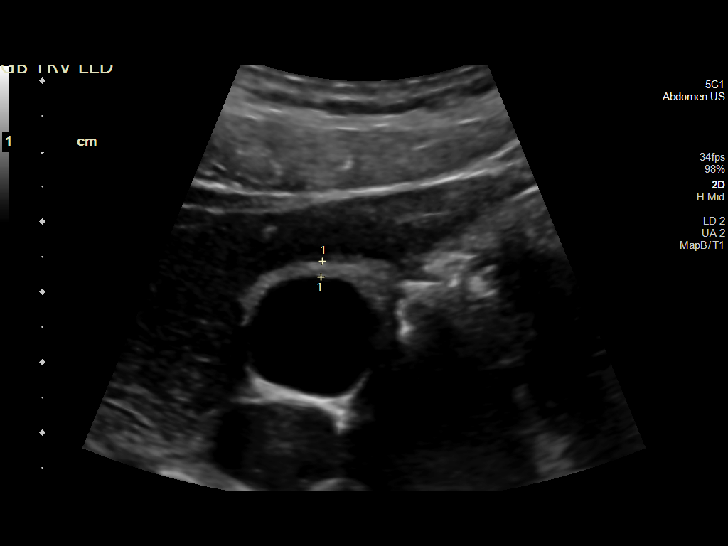

[14 of 25 positions shown; findings below may reference images not displayed]

FINDINGS: Gallbladder:

No gallstones or wall thickening visualized. No sonographic Murphy
sign noted by sonographer.

Common bile duct:

Diameter: 2.4 mm, nondilated

Liver:

No focal lesion identified. Within normal limits in parenchymal
echogenicity. Portal vein is patent on color Doppler imaging with
normal direction of blood flow towards the liver.

Other: None.
IMPRESSION: Unremarkable right upper quadrant ultrasound.

## 2022-12-13 IMAGING — DX DG RIBS W/ CHEST 3+V*L*
3 series · 3 of 3 positions shown · non-contrast
Comparison: None.

CLINICAL DATA: Recent fall with left rib pain, initial encounter

EXAM:
LEFT RIBS AND CHEST - 3+ VIEW

[chest pa]
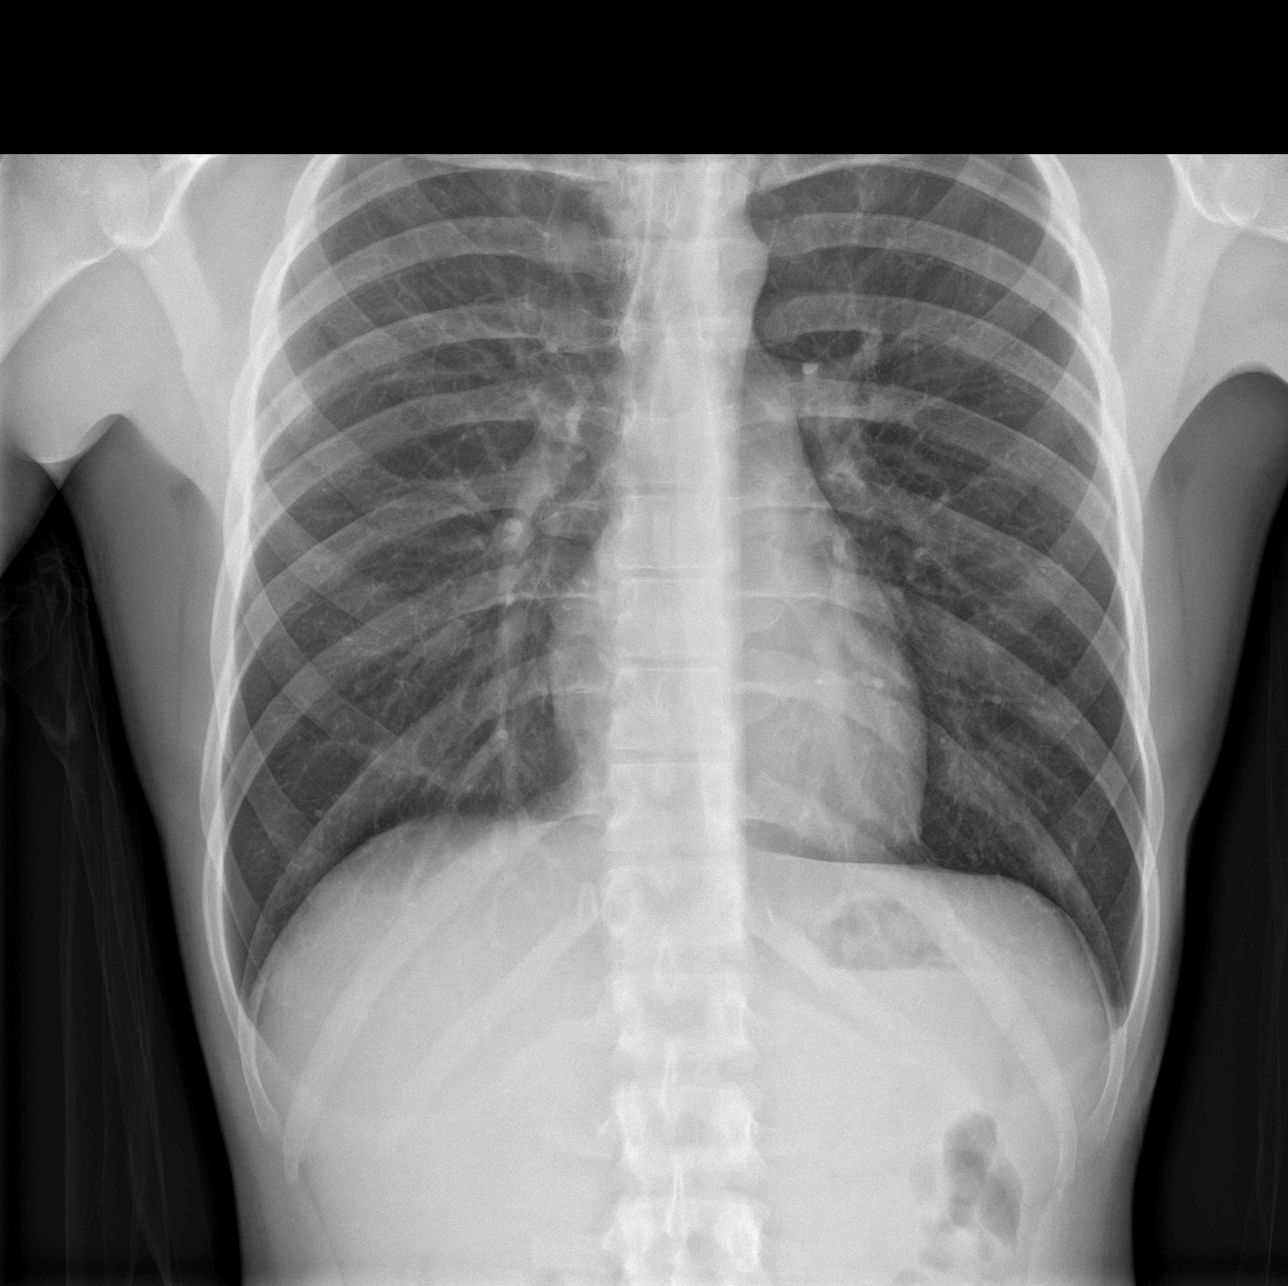

[rib obl]
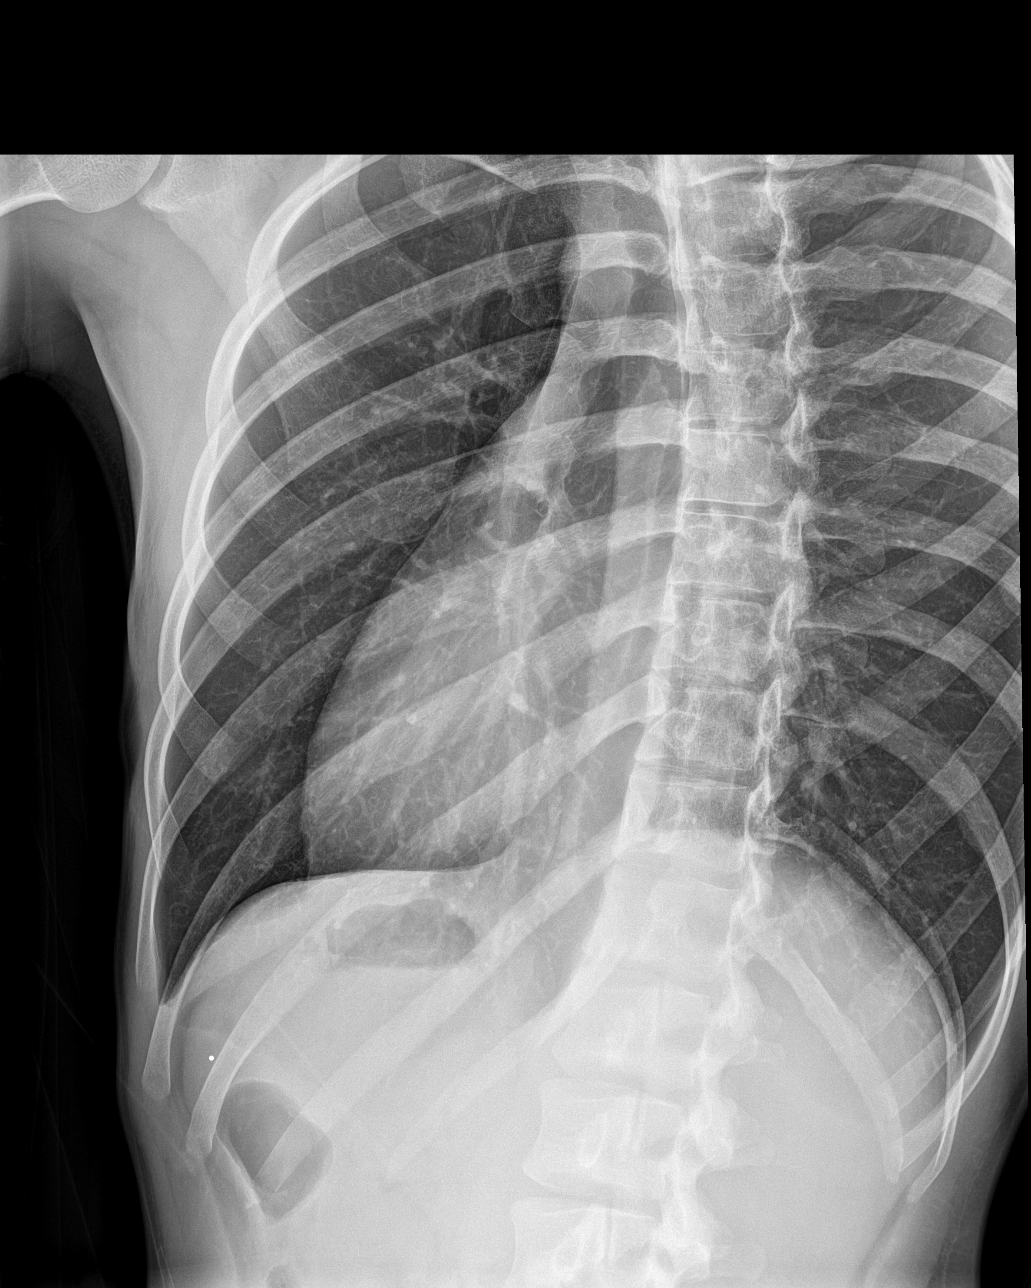

[rib pa]
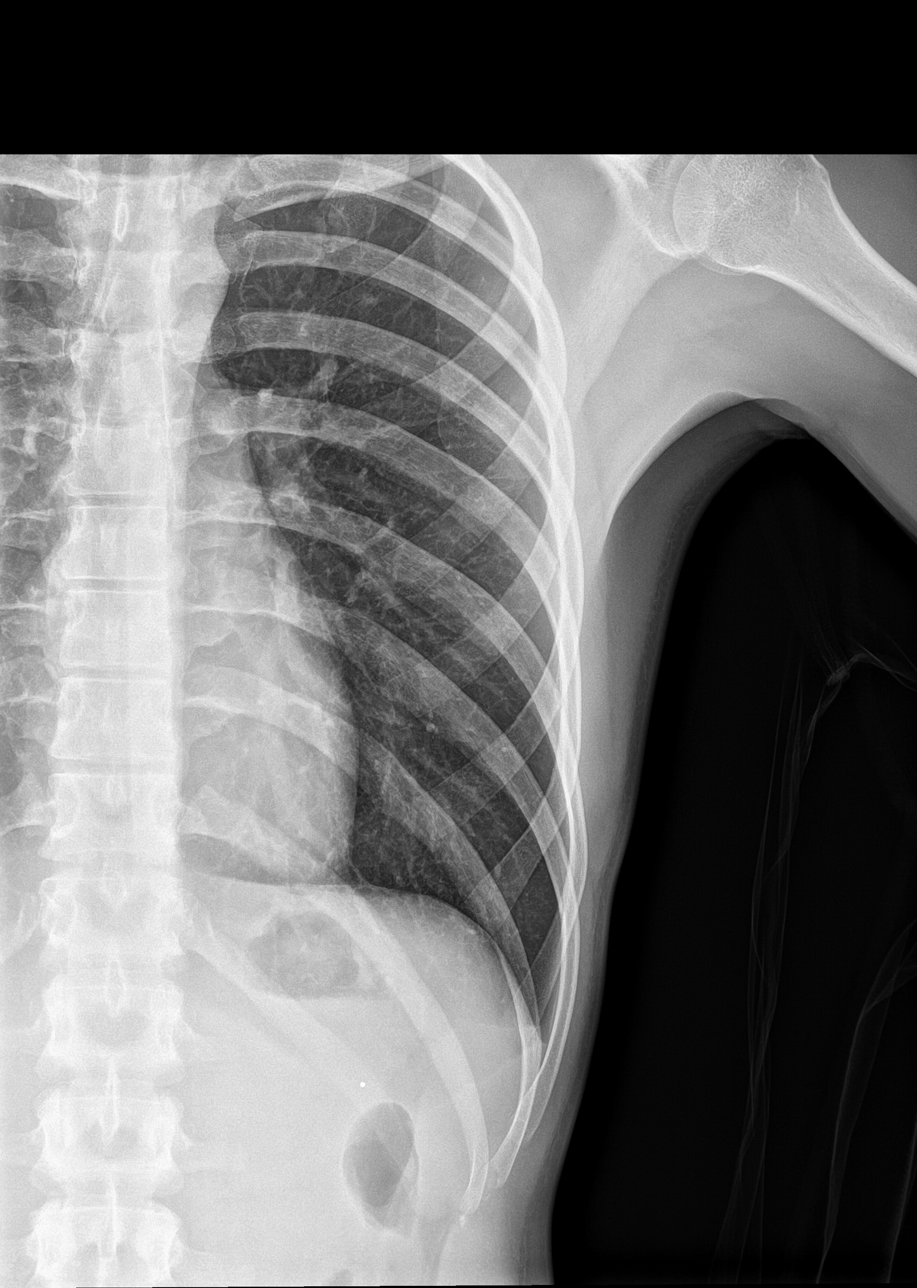

[3 of 3 positions shown; findings below may reference images not displayed]

FINDINGS: No fracture or other bone lesions are seen involving the ribs. There
is no evidence of pneumothorax or pleural effusion. Both lungs are
clear. Heart size and mediastinal contours are within normal limits.
IMPRESSION: No acute abnormality noted.

## 2022-12-24 ENCOUNTER — Institutional Professional Consult (permissible substitution): Payer: Self-pay | Admitting: Pulmonary Disease

## 2023-07-30 ENCOUNTER — Other Ambulatory Visit: Payer: Self-pay

## 2023-07-30 ENCOUNTER — Emergency Department (HOSPITAL_BASED_OUTPATIENT_CLINIC_OR_DEPARTMENT_OTHER): Payer: No Typology Code available for payment source | Admitting: Radiology

## 2023-07-30 ENCOUNTER — Encounter (HOSPITAL_BASED_OUTPATIENT_CLINIC_OR_DEPARTMENT_OTHER): Payer: Self-pay | Admitting: Emergency Medicine

## 2023-07-30 ENCOUNTER — Emergency Department (HOSPITAL_BASED_OUTPATIENT_CLINIC_OR_DEPARTMENT_OTHER)
Admission: EM | Admit: 2023-07-30 | Discharge: 2023-07-30 | Disposition: A | Discharge status: Home / Self Care | Payer: No Typology Code available for payment source | Attending: Emergency Medicine | Admitting: Emergency Medicine

## 2023-07-30 DIAGNOSIS — W500XXA Accidental hit or strike by another person, initial encounter: Secondary | ICD-10-CM | POA: Insufficient documentation

## 2023-07-30 DIAGNOSIS — Y9351 Activity, roller skating (inline) and skateboarding: Secondary | ICD-10-CM | POA: Insufficient documentation

## 2023-07-30 DIAGNOSIS — S6992XA Unspecified injury of left wrist, hand and finger(s), initial encounter: Secondary | ICD-10-CM

## 2023-07-30 DIAGNOSIS — S60812A Abrasion of left wrist, initial encounter: Secondary | ICD-10-CM | POA: Insufficient documentation

## 2023-07-30 DIAGNOSIS — M7989 Other specified soft tissue disorders: Secondary | ICD-10-CM | POA: Insufficient documentation

## 2023-07-30 NOTE — Discharge Instructions (Signed)
You were evaluated in the emergency room for left wrist injury.  Your x-ray was negative for fracture.  I suspect you likely sprained your wrist.  You were fitted with a wrist brace and referred to orthopedics.  Please follow-up with them within the next week.  You can use Tylenol 1000 mg and/or ibuprofen 600 mg up to 3 times a day.  If you experience any new or worsening symptoms including extreme worsening pain and numbness in your hand please return to the emergency room.

## 2023-07-30 NOTE — ED Triage Notes (Signed)
Pt to ED c/o left wrist pain.  States was speed skating and fell and someone else landed on patient's left wrist.  Denies hitting head or LOC.  Denies other pain.  Some swelling noted to anterior left wrist, able to move fingers but limited and limited when moving wrist.

## 2023-07-30 NOTE — ED Provider Notes (Signed)
[place] EMERGENCY DEPARTMENT AT [facility] Provider Note   CSN: [id number] Arrival date & time: 07/30/23  1831     History  Chief Complaint  Patient presents with   Wrist Injury    Kevin [name] is a 28 y.o. male who presents with acute left wrist pain.  Patient states he fell and someone landed on his wrist.  Patient denies any numbness or tingling to his wrist.  He reports prior old injury to his radius.  He is concerned he broke his wrist again.   Wrist Injury      Home Medications Prior to Admission medications   Medication Sig Start Date End Date Taking? Authorizing Provider  Cholecalciferol 100 MCG (4000 UT) TABS TAKE TWO TABLETS BY MOUTH ONCE A DAY FOR VITAMIN D SUPPLEMENTATION 11/21/21   [provider]  cyanocobalamin (VITAMIN B12) 500 MCG tablet Take 500 mcg by mouth daily. 11/21/21   [provider]  lamoTRIgine (LAMICTAL) 25 MG tablet Take 75 mg by mouth daily. 08/13/22   [provider]  ondansetron (ZOFRAN) 4 MG tablet Take 1 tablet (4 mg total) by mouth every 6 (six) hours. 07/04/22   Rexford Maus, DO      Allergies    Aripiprazole    Review of Systems   Review of Systems  Musculoskeletal:  Positive for arthralgias and myalgias.    Physical Exam Updated Vital Signs BP 112/67 (BP Location: Right Arm)   Pulse 67   Temp 97.8 F (36.6 C)   Resp 18   Ht 5\' 9"  (1.753 m)   Wt 59 kg   SpO2 100%   BMI 19.20 kg/m  Physical Exam Vitals and nursing note reviewed.  Constitutional:      General: He is not in acute distress.    Appearance: He is well-developed.  HENT:     Head: Normocephalic and atraumatic.  Eyes:     Conjunctiva/sclera: Conjunctivae normal.  Cardiovascular:     Rate and Rhythm: Normal rate.     Pulses: Normal pulses.  Pulmonary:     Effort: Pulmonary effort is normal. No respiratory distress.  Musculoskeletal:     Cervical back: Neck supple.     Comments: Examination of left upper extremity  demonstrates tenderness to distal radius, there is no significant swelling or ecchymosis, very minimal superficial abrasion is noted.  He has no snuffbox tenderness, he is capable of making full fist and moving his digits, cap refills less than 2 secs, NVI, radial pulse is brisk and symmetric  Skin:    General: Skin is warm and dry.     Capillary Refill: Capillary refill takes less than 2 seconds.  Neurological:     Mental Status: He is alert.  Psychiatric:        Mood and Affect: Mood normal.     ED Results / Procedures / Treatments   Labs (all labs ordered are listed, but only abnormal results are displayed) Labs Reviewed - No data to display  EKG None  Radiology DG Wrist Complete Left Result Date: 07/30/2023 CLINICAL DATA:  Fall, left wrist pain EXAM: LEFT WRIST - COMPLETE 3+ VIEW COMPARISON:  01/16/2022, 06/04/2021 hip FINDINGS: There is no evidence of fracture or dislocation. There is no evidence of arthropathy or other focal bone abnormality. Soft tissues are unremarkable. IMPRESSION: Negative. Electronically Signed   By: Duanne Guess D.O.   On: 07/30/2023 20:23    Procedures Procedures    Medications Ordered in ED Medications - No data  to display  ED Course/ Medical Decision Making/ A&P                                 Medical Decision Making Amount and/or Complexity of Data Reviewed Radiology: ordered.   This patient presents to the ED with chief complaint(s) of left wrist injury.  The complaint involves an extensive differential diagnosis and also carries with it a high risk of complications and morbidity.   pertinent past medical history as listed in HPI  The differential diagnosis includes  Fracture, sprain, tendon versus vascular versus nerve involvement The initial plan is to  Obtain plain films of left wrist Additional history obtained: No additional historians or records utilized  Initial Assessment:   Patient presents following injury to left  wrist.  On exam he has tenderness to distal radius.  No snuffbox tenderness.  He is capable of making a full fist.  And gently mobilizing his wrist.  Pulses intact.  NVI.  Overall no suspicion for tendon, vascular or nerve component.  There is no fracture evident on x-ray.  Overall most suspicious for left wrist sprain.  Independent ECG interpretation:  none  Independent labs interpretation:  The following labs were independently interpreted:  none  Independent visualization and interpretation of imaging: I independently visualized the following imaging with scope of interpretation limited to determining acute life threatening conditions related to emergency care: Left wrist x-ray, which revealed no acute abnormality  Treatment and Reassessment: none  Consultations obtained:   none  Disposition:   Patient will be discharged home.  Placed in a wrist brace and referred to orthopedics.  Offered something for pain.  However he declines.  The patient has been appropriately medically screened and/or stabilized in the ED. I have low suspicion for any other emergent medical condition which would require further screening, evaluation or treatment in the ED or require inpatient management. At time of discharge the patient is hemodynamically stable and in no acute distress. I have discussed work-up results and diagnosis with patient and answered all questions. Patient is agreeable with discharge plan. We discussed strict return precautions for returning to the emergency department and they verbalized understanding.     Social Determinants of Health:   none  This note was dictated with voice recognition software.  Despite best efforts at proofreading, errors may have occurred which can change the documentation meaning.          Final Clinical Impression(s) / ED Diagnoses Final diagnoses:  Injury of left wrist, initial encounter    Rx / DC Orders ED Discharge Orders     None          Halford Decamp, PA-C 07/30/23 2241    Vanetta Mulders, MD 08/01/23 [phone]

## 2023-07-30 NOTE — ED Notes (Signed)
RN reviewed discharge instructions with pt. Pt verbalized understanding and had no further questions. VSS upon discharge.

## 2023-08-26 ENCOUNTER — Emergency Department (HOSPITAL_BASED_OUTPATIENT_CLINIC_OR_DEPARTMENT_OTHER)
Admission: EM | Admit: 2023-08-26 | Discharge: 2023-08-26 | Disposition: A | Discharge status: Home / Self Care | Attending: Emergency Medicine | Admitting: Emergency Medicine

## 2023-08-26 ENCOUNTER — Emergency Department (HOSPITAL_BASED_OUTPATIENT_CLINIC_OR_DEPARTMENT_OTHER)

## 2023-08-26 ENCOUNTER — Encounter (HOSPITAL_BASED_OUTPATIENT_CLINIC_OR_DEPARTMENT_OTHER): Payer: Self-pay

## 2023-08-26 ENCOUNTER — Other Ambulatory Visit: Payer: Self-pay

## 2023-08-26 DIAGNOSIS — K529 Noninfective gastroenteritis and colitis, unspecified: Secondary | ICD-10-CM | POA: Insufficient documentation

## 2023-08-26 DIAGNOSIS — R112 Nausea with vomiting, unspecified: Secondary | ICD-10-CM | POA: Diagnosis present

## 2023-08-26 HISTORY — DX: Crohn's disease of large intestine without complications: K50.10

## 2023-08-26 LAB — LIPASE, BLOOD: Lipase: 64 U/L — ABNORMAL HIGH (ref 11–51)

## 2023-08-26 LAB — URINALYSIS, ROUTINE W REFLEX MICROSCOPIC
Bilirubin Urine: NEGATIVE
Glucose, UA: NEGATIVE mg/dL
Hgb urine dipstick: NEGATIVE
Ketones, ur: 15 mg/dL — AB
Leukocytes,Ua: NEGATIVE
Nitrite: NEGATIVE
Specific Gravity, Urine: 1.037 — ABNORMAL HIGH (ref 1.005–1.030)
pH: 6 (ref 5.0–8.0)

## 2023-08-26 LAB — CBC
HCT: 41.3 % (ref 39.0–52.0)
Hemoglobin: 13.7 g/dL (ref 13.0–17.0)
MCH: 28.5 pg (ref 26.0–34.0)
MCHC: 33.2 g/dL (ref 30.0–36.0)
MCV: 85.9 fL (ref 80.0–100.0)
Platelets: 195 10*3/uL (ref 150–400)
RBC: 4.81 MIL/uL (ref 4.22–5.81)
RDW: 12.7 % (ref 11.5–15.5)
WBC: 6.7 10*3/uL (ref 4.0–10.5)
nRBC: 0 % (ref 0.0–0.2)

## 2023-08-26 LAB — COMPREHENSIVE METABOLIC PANEL
ALT: 16 U/L (ref 0–44)
AST: 26 U/L (ref 15–41)
Albumin: 5.2 g/dL — ABNORMAL HIGH (ref 3.5–5.0)
Alkaline Phosphatase: 36 U/L — ABNORMAL LOW (ref 38–126)
Anion gap: 7 (ref 5–15)
BUN: 31 mg/dL — ABNORMAL HIGH (ref 6–20)
CO2: 27 mmol/L (ref 22–32)
Calcium: 10.3 mg/dL (ref 8.9–10.3)
Chloride: 103 mmol/L (ref 98–111)
Creatinine, Ser: 1.14 mg/dL (ref 0.61–1.24)
GFR, Estimated: >60 mL/min (ref >60)
Glucose, Bld: 89 mg/dL (ref 70–99)
Potassium: 4.3 mmol/L (ref 3.5–5.1)
Sodium: 137 mmol/L (ref 135–145)
Total Bilirubin: 0.5 mg/dL (ref 0.0–1.2)
Total Protein: 7.2 g/dL (ref 6.5–8.1)

## 2023-08-26 MED ORDER — MAALOX 600 MG PO CHEW: 600.0000 mg | CHEWABLE_TABLET | ORAL | 0 refills | Status: DC | PRN

## 2023-08-26 MED ORDER — IOHEXOL 300 MG/ML  SOLN
100.0000 mL | Freq: Once | INTRAMUSCULAR | Status: AC | PRN
  Administered 2023-08-26: 100 mL via INTRAVENOUS

## 2023-08-26 MED ORDER — AZITHROMYCIN 250 MG PO TABS
500.0000 mg | ORAL_TABLET | Freq: Once | ORAL | Status: AC
  Administered 2023-08-26: 500 mg via ORAL
  Filled 2023-08-26: qty 2

## 2023-08-26 MED ORDER — ONDANSETRON 4 MG PO TBDP
4.0000 mg | ORAL_TABLET | Freq: Once | ORAL | Status: AC | PRN
  Administered 2023-08-26: 4 mg via ORAL
  Filled 2023-08-26: qty 1

## 2023-08-26 MED ORDER — ONDANSETRON HCL 4 MG PO TABS: 4.0000 mg | ORAL_TABLET | Freq: Three times a day (TID) | ORAL | 0 refills | Status: DC | PRN

## 2023-08-26 MED ORDER — AZITHROMYCIN 250 MG PO TABS: 250.0000 mg | ORAL_TABLET | Freq: Every day | ORAL | 0 refills | Status: DC

## 2023-08-26 MED ORDER — ALUM & MAG HYDROXIDE-SIMETH 200-200-20 MG/5ML PO SUSP
15.0000 mL | Freq: Once | ORAL | Status: AC
  Administered 2023-08-26: 15 mL via ORAL
  Filled 2023-08-26: qty 30

## 2023-08-26 MED ORDER — SODIUM CHLORIDE 0.9 % IV BOLUS
1000.0000 mL | Freq: Once | INTRAVENOUS | Status: AC
  Administered 2023-08-26: 1000 mL via INTRAVENOUS

## 2023-08-26 NOTE — ED Provider Notes (Signed)
 [place] EMERGENCY DEPARTMENT AT [facility] Provider Note   CSN: [id number] Arrival date & time: 08/26/23  6213     History  Chief Complaint  Patient presents with   Emesis    Kevin [name] is a 28 y.o. male.  HPI Kevin [name] is here with abdominal pain, nausea, vomiting, and bloody stool x1 starting last night. He tells me that symptoms started abruptly at 2330 last night. He last ate at 2200 and is concerned he may have had some bad parmesan. He says the taste was off.  He has a history of bloody stools about 2 years ago that were worked up by the [place], including colonoscopy. He was told that he "probably" had Crohn's disease but they could not confirm diagnosis. He has not been on any medications for this and has been doing well since with no further bloody BM until last night.  He denies any fever/chills. Nausea has resolved after Zofran x1 but still having some central abdominal pain that he describes as "burning." He has a history of prior appendectomy.      Home Medications Prior to Admission medications   Medication Sig Start Date End Date Taking? Authorizing Provider  azithromycin (ZITHROMAX) 250 MG tablet Take 1 tablet (250 mg total) by mouth daily. Take first 2 tablets together, then 1 every day until finished. 08/27/23  Yes Alicia Amel, MD  Calcium Carbonate Antacid (MAALOX) 600 MG chewable tablet Chew 1 tablet (600 mg total) by mouth every 4 (four) hours as needed for heartburn. 08/26/23  Yes Alicia Amel, MD  ondansetron (ZOFRAN) 4 MG tablet Take 1 tablet (4 mg total) by mouth every 8 (eight) hours as needed for nausea or vomiting. 08/26/23  Yes Alicia Amel, MD  Cholecalciferol 100 MCG (4000 UT) TABS TAKE TWO TABLETS BY MOUTH ONCE A DAY FOR VITAMIN D SUPPLEMENTATION 11/21/21   [provider]  cyanocobalamin (VITAMIN B12) 500 MCG tablet Take 500 mcg by mouth daily. 11/21/21   [provider]  lamoTRIgine (LAMICTAL) 25 MG tablet Take  75 mg by mouth daily. 08/13/22   [provider]  ondansetron (ZOFRAN) 4 MG tablet Take 1 tablet (4 mg total) by mouth every 6 (six) hours. 07/04/22   Rexford Maus, DO      Allergies    Aripiprazole    Review of Systems   Review of Systems  Gastrointestinal:  Positive for abdominal pain, blood in stool and nausea. Negative for constipation and diarrhea.    Physical Exam Updated Vital Signs BP 115/83   Pulse (!) 106   Temp 98.1 F (36.7 C) (Oral)   Resp 16   Ht 5\' 9"  (1.753 m)   Wt 62.1 kg   SpO2 96%   BMI 20.23 kg/m  Physical Exam Constitutional:      Comments: Well-appearing and NAD.   HENT:     Mouth/Throat:     Mouth: Mucous membranes are moist.  Cardiovascular:     Rate and Rhythm: Normal rate and regular rhythm.     Pulses: Normal pulses.  Pulmonary:     Effort: Pulmonary effort is normal. No respiratory distress.  Abdominal:     General: Abdomen is flat.     Palpations: Abdomen is soft.     Tenderness: There is abdominal tenderness (mild, periumbilical).  Skin:    General: Skin is warm and dry.  Neurological:     General: No focal deficit present.  Psychiatric:  Mood and Affect: Mood normal.        Behavior: Behavior normal.     ED Results / Procedures / Treatments   Labs (all labs ordered are listed, but only abnormal results are displayed) Labs Reviewed  LIPASE, BLOOD - Abnormal; Notable for the following components:      Result Value   Lipase 64 (*)    All other components within normal limits  COMPREHENSIVE METABOLIC PANEL - Abnormal; Notable for the following components:   BUN 31 (*)    Albumin 5.2 (*)    Alkaline Phosphatase 36 (*)    All other components within normal limits  URINALYSIS, ROUTINE W REFLEX MICROSCOPIC - Abnormal; Notable for the following components:   Specific Gravity, Urine 1.037 (*)    Ketones, ur 15 (*)    Protein, ur TRACE (*)    All other components within normal limits  CBC     EKG None  Radiology CT ABDOMEN PELVIS W CONTRAST Result Date: 08/26/2023 CLINICAL DATA:  Abdominal pain. EXAM: CT ABDOMEN AND PELVIS WITH CONTRAST TECHNIQUE: Multidetector CT imaging of the abdomen and pelvis was performed using the standard protocol following bolus administration of intravenous contrast. RADIATION DOSE REDUCTION: This exam was performed according to the departmental dose-optimization program which includes automated exposure control, adjustment of the mA and/or kV according to patient size and/or use of iterative reconstruction technique. CONTRAST:  OMNIPAQUE IOHEXOL 300 MG/ML  SOLN COMPARISON:  07/04/2022 FINDINGS: Lower chest: No acute abnormality. Hepatobiliary: No focal liver abnormality is seen. No gallstones, gallbladder wall thickening, or biliary dilatation. Pancreas: Unremarkable. No pancreatic ductal dilatation or surrounding inflammatory changes. Spleen: Normal in size without focal abnormality. Adrenals/Urinary Tract: Adrenal glands are unremarkable. Kidneys are normal, without renal calculi, focal lesion, or hydronephrosis. Bladder is unremarkable. Stomach/Bowel: Stomach is within normal limits. Signs of previous appendectomy. Within the left lower quadrant there is a loop of small bowel measuring 3.2 cm in diameter containing fecalized material, image 50/2. The remaining bowel loops are within normal limits. No significant wall thickening or inflammatory changes. Vascular/Lymphatic: No significant vascular abnormality. The upper abdominal vascularity is patent. No enlarged abdominopelvic lymph nodes. Reproductive: Prostate is unremarkable. Other: No abdominal wall hernia or abnormality. No abdominopelvic ascites. Musculoskeletal: No acute or significant osseous findings. IMPRESSION: 1. Within the left lower quadrant there is a loop of small bowel measuring 3.2 cm in diameter containing fecalized material. This may reflect slow transit, possibly secondary to underlying  inflammatory or infectious enteritis. Differential considerations include focal ileus versus low-grade partial obstruction. 2. Status post appendectomy. Electronically Signed   By: Signa Kell M.D.   On: 08/26/2023 05:18    Procedures Procedures   Medications Ordered in ED Medications  ondansetron (ZOFRAN-ODT) disintegrating tablet 4 mg (4 mg Oral Given 08/26/23 0147)  iohexol (OMNIPAQUE) 300 MG/ML solution 100 mL (100 mLs Intravenous Contrast Given 08/26/23 0501)  sodium chloride 0.9 % bolus 1,000 mL (0 mLs Intravenous Stopped 08/26/23 0750)  alum & mag hydroxide-simeth (MAALOX/MYLANTA) 200-200-20 MG/5ML suspension 15 mL (15 mLs Oral Given 08/26/23 0832)  azithromycin (ZITHROMAX) tablet 500 mg (500 mg Oral Given 08/26/23 0915)    ED Course/ Medical Decision Making/ A&P Clinical Course as of 08/26/23 0981  Mon Aug 26, 2023  0824 Symptoms improved, tolerating PO w/o difficulty [SG]    Clinical Course User Index [SG] Sloan Leiter, DO    Medical Decision Making Initial lab workup significant for apparent dehydration on UA with increased spec grav (1.037, Ketones and  protein). No elevated WBC or anemia on CBC. Lipase with minimal elevation at 64. CT AP obtained which shows a loop of small bowel in the LLQ measuring 3.2cm and containing fecalized material, thought possibly secondary to underlying inflammatory or infectious enteritis. Differential considerations include focal ileus versus low-grade partial obstruction.  Considerations include acute infectious enteritis related to his food last night versus a potential flare of his Crohn's disease. He is hemodynamically stable and overall well-appearing. He tolerated PO challenge so do not believe he is obstructed or having an acute ileus. He is well-hydrated on exam s/p 1L IVF bolus and PO fluids. He is stable for outpatient follow-up. He is already plugged in with outpatient GI through the [place]. He would, however, like to seek a second opinion  at [facility].  Given bloody stool, consider complex enteritis/colitis and will cover with antibiotics. First dose of Azithromycin given here and will send outpatient course to his pharmacy.    Final Clinical Impression(s) / ED Diagnoses Final diagnoses:  Enteritis    Rx / DC Orders ED Discharge Orders          Ordered    azithromycin (ZITHROMAX) 250 MG tablet  Daily        08/26/23 0853    Calcium Carbonate Antacid (MAALOX) 600 MG chewable tablet  Every 4 hours PRN        08/26/23 0845    ondansetron (ZOFRAN) 4 MG tablet  Every 8 hours PRN        08/26/23 0845    Ambulatory referral to Gastroenterology       Comments: Seen in ED for bloody stools. Small bowel dilatation with fecalized material noted on CT, ?infectious enteritis vs inflammatory. Previously followed at [facility] and told he had "probable" Crohn's. Not on medications.   08/26/23 0850           J Dorothyann Gibbs, MD PGY-3 [facility] 08/26/23 9:22 AM    Alicia Amel, MD 08/26/23 1610    Sloan Leiter, DO 08/27/23 [phone]

## 2023-08-26 NOTE — ED Notes (Signed)
 Pt given discharge instructions and reviewed prescriptions. Opportunities given for questions. Pt verbalizes understanding. PIV removed x1. Jillyn Hidden, RN

## 2023-08-26 NOTE — Discharge Instructions (Addendum)
 Mr. Kevin, [name] to meet you. Your symptoms likely are related to the food you had last night, but we cannot rule out Crohn's as a contributor here. Follow-up with your GI team at the [facility] or call [facility] at [phone]. I have sent them a referral.  Given the possibility that this is infectious in nature, I am going to cover you with some antibiotics. I am giving you the first dose here today and sending the remaining doses to the [facility] pharmacy.  I'm sending more of the Maalox and Zofran to your pharmacy as well. If you cannot keep food down or have several episodes of bloody diarrhea, come back to see [place] ASAP.   Eliezer Mccoy, MD

## 2023-08-26 NOTE — ED Triage Notes (Signed)
 Pt POV d/t emesis and mid ABD and bloody stools.  Pt thinks he ate something bad.  Pt has hx of bloody stools and [facility].

## 2023-11-25 ENCOUNTER — Ambulatory Visit (INDEPENDENT_AMBULATORY_CARE_PROVIDER_SITE_OTHER): Admitting: Family Medicine

## 2023-11-25 ENCOUNTER — Encounter (HOSPITAL_BASED_OUTPATIENT_CLINIC_OR_DEPARTMENT_OTHER): Payer: Self-pay | Admitting: Family Medicine

## 2023-11-25 VITALS — BP 120/78 | HR 59 | Ht 69.0 in | Wt 145.8 lb

## 2023-11-25 DIAGNOSIS — H6121 Impacted cerumen, right ear: Secondary | ICD-10-CM

## 2023-11-25 DIAGNOSIS — I959 Hypotension, unspecified: Secondary | ICD-10-CM | POA: Insufficient documentation

## 2023-11-25 DIAGNOSIS — M545 Low back pain, unspecified: Secondary | ICD-10-CM | POA: Diagnosis not present

## 2023-11-25 DIAGNOSIS — F3131 Bipolar disorder, current episode depressed, mild: Secondary | ICD-10-CM | POA: Insufficient documentation

## 2023-11-25 DIAGNOSIS — Z7189 Other specified counseling: Secondary | ICD-10-CM | POA: Insufficient documentation

## 2023-11-25 DIAGNOSIS — Z0000 Encounter for general adult medical examination without abnormal findings: Secondary | ICD-10-CM

## 2023-11-25 DIAGNOSIS — H6120 Impacted cerumen, unspecified ear: Secondary | ICD-10-CM | POA: Insufficient documentation

## 2023-11-25 DIAGNOSIS — K219 Gastro-esophageal reflux disease without esophagitis: Secondary | ICD-10-CM | POA: Insufficient documentation

## 2023-11-25 NOTE — Progress Notes (Signed)
 New Patient Office Visit  Subjective   Patient ID: Kevin [name], male    DOB: Mar 11, 1996  Age: 28 y.o. MRN: [id number]  CC:  Chief Complaint  Patient presents with   New Patient (Initial Visit)    New Patient has not had pcp patient has lower back injury has been hurting him for 8-9 months sharp shooting pain constant pain unless sitting for awhile constant headache on right side of head for 3 weeks     HPI Kevin [name] presents to establish care Last PCP - none - [facility]  Reports history of depression  and stuff and [facility]. Prescribed lamotrigine  for this.  Has been having low back pain. Brought up to [facility] provider, but told that it is not something they would handle. 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 No prior issues before this injury. Worse with laying flat, better sitting.  Has been having headache, primarily right side. Has been about 3 weeks with this. History of occasional headache, reports migraines. Thinks it could be ear infection. Has tried Tylenol  and ibuprofen in the past.  Patient is originally from [place] . Patient works in Buyer, retail. He enjoys running, speed skating, playing with his dog.  Outpatient Encounter Medications as of 11/25/2023  Medication Sig   lamoTRIgine  (LAMICTAL ) 25 MG tablet Take 75 mg by mouth daily.   [DISCONTINUED] azithromycin  (ZITHROMAX ) 250 MG tablet Take 1 tablet (250 mg total) by mouth daily. Take first 2 tablets together, then 1 every day until finished.   [DISCONTINUED] Calcium Carbonate Antacid (MAALOX) 600 MG chewable tablet Chew 1 tablet (600 mg total) by mouth every 4 (four) hours as needed for heartburn.   [DISCONTINUED] Cholecalciferol  100 MCG (4000 UT) TABS TAKE TWO TABLETS BY MOUTH ONCE A DAY FOR VITAMIN D  SUPPLEMENTATION   [DISCONTINUED] cyanocobalamin (VITAMIN B12) 500  MCG tablet Take 500 mcg by mouth daily.   [DISCONTINUED] ondansetron  (ZOFRAN ) 4 MG tablet Take 1 tablet (4 mg total) by mouth every 6 (six) hours.   [DISCONTINUED] ondansetron  (ZOFRAN ) 4 MG tablet Take 1 tablet (4 mg total) by mouth every 8 (eight) hours as needed for nausea or vomiting.   No facility-administered encounter medications on file as of 11/25/2023.    Past Medical History:  Diagnosis Date   Bipolar depression (HCC)    Crohn's colitis (HCC)    Thrombosed hemorrhoids     Past Surgical History:  Procedure Laterality Date   APPENDECTOMY      Family History  Problem Relation Age of Onset   Colon cancer Other        grandfather   Pancreatic cancer Other        aunt    Social History   Socioeconomic History   Marital status: Single    Spouse name: Not on file   Number of children: Not on file   Years of education: Not on file   Highest education level: Not on file  Occupational History   Not on file  Tobacco Use   Smoking status: Never    Passive exposure: Never   Smokeless tobacco: Never  Vaping Use   Vaping status: Never Used  Substance and Sexual Activity   Alcohol use: Not Currently   Drug use: Never   Sexual activity: Not on file  Other Topics Concern   Not on file  Social History  Narrative   Not on file   Social Drivers of Health   Financial Resource Strain: Not on file  Food Insecurity: Not on file  Transportation Needs: Not on file  Physical Activity: Not on file  Stress: Not on file  Social Connections: Not on file  Intimate Partner Violence: Not on file    Objective   BP 120/78 (BP Location: Right Arm, Patient Position: Sitting, Cuff Size: Normal)   Pulse (!) 59   Ht 5' 9 (1.753 m)   Wt 145 lb 12.8 oz (66.1 kg)   SpO2 97%   BMI 21.53 kg/m   Physical Exam  28 year old male in no acute distress Cardiovascular exam with regular rate and rhythm Lungs clear to auscultation bilaterally Lumbar spine: Visual inspection without  obvious abnormality No tenderness to palpation over spinous processes, mild tenderness palpation through paraspinal musculature bilaterally Normal forward flexion, no pain elicited.  Normal back extension, no pain elicited.  Negative straight leg raise External auditory canal with impacted cerumen noted on right side.  Assessment & Plan:   Right-sided low back pain without sciatica, unspecified chronicity Assessment & Plan: History and exam generally reassuring in regards to no red flags.  Given this, would recommend proceeding with referral to physical therapy.  He has worked with [facility], discussed that there is more evidence supporting use of physical therapy treatment modalities.  We will monitor progress with conservative measures.  If not improving as expected, likely would proceed with initial x-ray imaging, consider referral to spine specialist  Orders: -     Ambulatory referral to Physical Therapy  Impacted cerumen of right ear Assessment & Plan: Noted on exam, irrigation performed today.  Patient did have improvement in symptoms with this.  Instructed on proper ear care   Wellness examination -     CBC with Differential/Platelet; Future -     Comprehensive metabolic panel with GFR; Future -     Lipid panel; Future -     TSH Rfx on Abnormal to Free T4; Future  Return in about 3 months (around 02/25/2024) for CPE with fasting labs 1 week prior.    ___________________________________________ Kevin [name] de [place], MD, ABFM, CAQSM Primary Care and Sports Medicine [facility]

## 2023-11-25 NOTE — Patient Instructions (Signed)
  Medication Instructions:  Your physician recommends that you continue on your current medications as directed. Please refer to the Current Medication list given to you today. --If you need a refill on any your medications before your next appointment, please call your pharmacy first. If no refills are authorized on file call the office.-- Lab Work: Your physician has recommended that you have lab work today: 1 week before next visit  If you have labs (blood work) drawn today and your tests are completely normal, you will receive your results via MyChart message OR a phone call from our staff.  Please ensure you check your voicemail in the event that you authorized detailed messages to be left on a delegated number. If you have any lab test that is abnormal or we need to change your treatment, we will call you to review the results.  Referrals/Procedures/Imaging: Physical Therapy  Follow-Up: Your next appointment:   Your physician recommends that you schedule a follow-up appointment in: 2-3 months physical  with Dr. de [place]  You will receive a text message or e-mail with a link to a survey about your care and experience with us  today! We would greatly appreciate your feedback!   Thanks for letting us  be apart of your health journey!!  Primary Care and Sports Medicine   Dr. Court Distance [place]   We encourage you to activate your patient portal called "MyChart".  Sign up information is provided on this After Visit Summary.  MyChart is used to connect with patients for Virtual Visits (Telemedicine).  Patients are able to view lab/test results, encounter notes, upcoming appointments, etc.  Non-urgent messages can be sent to your provider as well. To learn more about what you can do with MyChart, please visit --  [email or web address].

## 2024-01-01 NOTE — Assessment & Plan Note (Signed)
 History and exam generally reassuring in regards to no red flags.  Given this, would recommend proceeding with referral to physical therapy.  He has worked with [facility], discussed that there is more evidence supporting use of physical therapy treatment modalities.  We will monitor progress with conservative measures.  If not improving as expected, likely would proceed with initial x-ray imaging, consider referral to spine specialist

## 2024-01-01 NOTE — Assessment & Plan Note (Signed)
 Noted on exam, irrigation performed today.  Patient did have improvement in symptoms with this.  Instructed on proper ear care

## 2024-01-12 ENCOUNTER — Encounter (HOSPITAL_BASED_OUTPATIENT_CLINIC_OR_DEPARTMENT_OTHER): Payer: Self-pay

## 2024-01-12 ENCOUNTER — Emergency Department (HOSPITAL_BASED_OUTPATIENT_CLINIC_OR_DEPARTMENT_OTHER): Admitting: Radiology

## 2024-01-12 ENCOUNTER — Other Ambulatory Visit: Payer: Self-pay

## 2024-01-12 ENCOUNTER — Emergency Department (HOSPITAL_BASED_OUTPATIENT_CLINIC_OR_DEPARTMENT_OTHER)
Admission: EM | Admit: 2024-01-12 | Discharge: 2024-01-13 | Disposition: A | Discharge status: Home / Self Care | Attending: Emergency Medicine | Admitting: Emergency Medicine

## 2024-01-12 DIAGNOSIS — X500XXA Overexertion from strenuous movement or load, initial encounter: Secondary | ICD-10-CM | POA: Diagnosis not present

## 2024-01-12 DIAGNOSIS — G8929 Other chronic pain: Secondary | ICD-10-CM | POA: Diagnosis not present

## 2024-01-12 DIAGNOSIS — M545 Low back pain, unspecified: Secondary | ICD-10-CM | POA: Diagnosis not present

## 2024-01-12 DIAGNOSIS — M79672 Pain in left foot: Secondary | ICD-10-CM | POA: Diagnosis present

## 2024-01-12 DIAGNOSIS — S93602A Unspecified sprain of left foot, initial encounter: Secondary | ICD-10-CM | POA: Insufficient documentation

## 2024-01-12 MED ORDER — KETOROLAC TROMETHAMINE 60 MG/2ML IM SOLN
30.0000 mg | Freq: Once | INTRAMUSCULAR | Status: AC
  Administered 2024-01-13: 30 mg via INTRAMUSCULAR
  Filled 2024-01-12: qty 2

## 2024-01-12 NOTE — ED Triage Notes (Signed)
 Pt states he was moving heavy tables Saturday and his left top of foot and back have been hurting

## 2024-01-13 MED ORDER — MELOXICAM 7.5 MG PO TABS: 7.5000 mg | ORAL_TABLET | Freq: Two times a day (BID) | ORAL | 0 refills | Status: AC

## 2024-01-13 NOTE — ED Provider Notes (Signed)
 [name] EMERGENCY DEPARTMENT AT [facility] Provider Note   CSN: [id number] Arrival date & time: 01/12/24  2318     Patient presents with: Foot Pain and Back Pain   Kevin [name] is a 28 y.o. male.   28 year old male who presents the ER today with foot pain and back pain.  Patient states the back pain is chronic over many months after getting discharged from the [employer] where he hurt it and some type of training.  He was told he had lumbosacral strain.  Has flared up recently with helping someone move but also with some foot pain on top of his foot.  He is already scheduled physical therapy for his back.  He just wants to make sure that nothing got worsened with the recent exacerbation of pain.  Denies any fever, incontinence, IV drug use, diabetes, cancer, paresthesias/anesthesias or other associated symptoms.   Foot Pain  Back Pain      Prior to Admission medications   Medication Sig Start Date End Date Taking? Authorizing Provider  meloxicam  (MOBIC ) 7.5 MG tablet Take 1 tablet (7.5 mg total) by mouth in the morning and at bedtime for 14 days. 01/13/24 01/27/24 Yes Kynsley Whitehouse, Selinda, MD  lamoTRIgine  (LAMICTAL ) 25 MG tablet Take 75 mg by mouth daily. 08/13/22   [provider]    Allergies: Aripiprazole     Review of Systems  Musculoskeletal:  Positive for back pain.    Updated Vital Signs BP 130/78 (BP Location: Right Arm)   Pulse 65   Temp 98 F (36.7 C) (Oral)   Resp 16   Ht 5' 9 (1.753 m)   Wt 61.2 kg   SpO2 100%   BMI 19.94 kg/m   Physical Exam Vitals and nursing note reviewed.  Constitutional:      Appearance: He is well-developed.  HENT:     Head: Normocephalic and atraumatic.  Cardiovascular:     Rate and Rhythm: Normal rate.  Pulmonary:     Effort: Pulmonary effort is normal. No respiratory distress.  Abdominal:     General: There is no distension.  Musculoskeletal:        General: Tenderness (anterior tibialis tendon insertion on top  of foot without erythema, warmth or significant edema.) present. Normal range of motion.     Cervical back: Normal range of motion.  Neurological:     Mental Status: He is alert.     (all labs ordered are listed, but only abnormal results are displayed) Labs Reviewed - No data to display  EKG: None  Radiology: DG Pelvis 1-2 Views Result Date: 01/13/2024 CLINICAL DATA:  Pelvic pain EXAM: PELVIS - 1-2 VIEW COMPARISON:  None Available. FINDINGS: There is no evidence of pelvic fracture or diastasis. No pelvic bone lesions are seen. IMPRESSION: Negative. Electronically Signed   By: Dorethia Molt M.D.   On: 01/13/2024 00:19   DG Lumbar Spine Complete Result Date: 01/13/2024 CLINICAL DATA:  Low back pain EXAM: LUMBAR SPINE - COMPLETE 4+ VIEW COMPARISON:  None Available. FINDINGS: There is no evidence of lumbar spine fracture. Straightening lumbar spine may relate to underlying muscular spasm. No listhesis. Intervertebral disc spaces are maintained. IMPRESSION: 1. Straightening of the lumbar spine may relate to underlying muscular spasm. No acute fracture or traumatic listhesis. Electronically Signed   By: Dorethia Molt M.D.   On: 01/13/2024 00:18   DG Foot Complete Left Result Date: 01/13/2024 CLINICAL DATA:  Left foot pain EXAM: LEFT FOOT - COMPLETE 3+ VIEW COMPARISON:  None  Available. FINDINGS: There is no evidence of fracture or dislocation. There is no evidence of arthropathy or other focal bone abnormality. Soft tissues are unremarkable. IMPRESSION: Negative. Electronically Signed   By: Dorethia Molt M.D.   On: 01/13/2024 00:17     Procedures   Medications Ordered in the ED  ketorolac  (TORADOL ) injection 30 mg (30 mg Intramuscular Given 01/13/24 0022)                                    Medical Decision Making Amount and/or Complexity of Data Reviewed Radiology: ordered.  Risk Prescription drug management.   Acute on chronic back pain no new injuries, no red flags for  immediate MRI. Likely foot sprain, will utilize NSAIDs, already has PCP and PT set up, will utilize them for further care.      Final diagnoses:  Chronic bilateral low back pain, unspecified whether sciatica present  Foot sprain, left, initial encounter    ED Discharge Orders          Ordered    meloxicam  (MOBIC ) 7.5 MG tablet  2 times daily        01/13/24 0113               Darnisha Vernet, Selinda, MD 01/13/24 [phone]

## 2024-01-17 ENCOUNTER — Emergency Department (HOSPITAL_BASED_OUTPATIENT_CLINIC_OR_DEPARTMENT_OTHER)
Admission: EM | Admit: 2024-01-17 | Discharge: 2024-01-18 | Disposition: A | Discharge status: Home / Self Care | Attending: Emergency Medicine | Admitting: Emergency Medicine

## 2024-01-17 ENCOUNTER — Other Ambulatory Visit: Payer: Self-pay

## 2024-01-17 ENCOUNTER — Encounter (HOSPITAL_BASED_OUTPATIENT_CLINIC_OR_DEPARTMENT_OTHER): Payer: Self-pay

## 2024-01-17 DIAGNOSIS — R52 Pain, unspecified: Secondary | ICD-10-CM

## 2024-01-17 DIAGNOSIS — R824 Acetonuria: Secondary | ICD-10-CM | POA: Diagnosis not present

## 2024-01-17 DIAGNOSIS — M791 Myalgia, unspecified site: Secondary | ICD-10-CM | POA: Diagnosis present

## 2024-01-17 MED ORDER — SODIUM CHLORIDE 0.9 % IV BOLUS
1000.0000 mL | Freq: Once | INTRAVENOUS | Status: AC
  Administered 2024-01-18: 1000 mL via INTRAVENOUS

## 2024-01-17 NOTE — ED Notes (Signed)
 CBG 94

## 2024-01-17 NOTE — ED Provider Notes (Signed)
 [place] EMERGENCY DEPARTMENT AT [facility] Provider Note   CSN: [id number] Arrival date & time: 01/17/24  2226     Patient presents with: Blood Sugar Problem   Kevin [name] is a 28 y.o. male.   Patient is a 28 year old male with past medical history of bipolar disorder, GERD, and insomnia.  Patient presenting today for evaluation of feeling like his body is burning all over.  The last time this happened he was told he had elevated ketones and is concerned this may be the case again.  He denies to me he is having any nausea or vomiting.  Nuys any fevers or chills.  No ill contacts.       Prior to Admission medications   Medication Sig Start Date End Date Taking? Authorizing Provider  lamoTRIgine  (LAMICTAL ) 25 MG tablet Take 75 mg by mouth daily. 08/13/22   [provider]  meloxicam  (MOBIC ) 7.5 MG tablet Take 1 tablet (7.5 mg total) by mouth in the morning and at bedtime for 14 days. 01/13/24 01/27/24  Mesner, Selinda, MD    Allergies: Aripiprazole     Review of Systems  All other systems reviewed and are negative.   Updated Vital Signs BP 123/87 (BP Location: Right Arm)   Pulse 69   Temp 97.6 F (36.4 C) (Tympanic)   Resp 18   Ht 5' 9 (1.753 m)   Wt 59 kg   SpO2 99%   BMI 19.20 kg/m   Physical Exam Vitals and nursing note reviewed.  Constitutional:      General: He is not in acute distress.    Appearance: He is well-developed. He is not diaphoretic.  HENT:     Head: Normocephalic and atraumatic.     Mouth/Throat:     Mouth: Mucous membranes are moist.  Cardiovascular:     Rate and Rhythm: Normal rate and regular rhythm.     Heart sounds: No murmur heard.    No friction rub.  Pulmonary:     Effort: Pulmonary effort is normal. No respiratory distress.     Breath sounds: Normal breath sounds. No wheezing or rales.  Abdominal:     General: Bowel sounds are normal. There is no distension.     Palpations: Abdomen is soft.     Tenderness:  There is no abdominal tenderness.  Musculoskeletal:        General: Normal range of motion.     Cervical back: Normal range of motion and neck supple.  Skin:    General: Skin is warm and dry.  Neurological:     Mental Status: He is alert and oriented to person, place, and time.     Coordination: Coordination normal.     (all labs ordered are listed, but only abnormal results are displayed) Labs Reviewed  CBG MONITORING, ED    EKG: None  Radiology: No results found.   Procedures   Medications Ordered in the ED  sodium chloride  0.9 % bolus 1,000 mL (has no administration in time range)                                    Medical Decision Making Amount and/or Complexity of Data Reviewed Labs: ordered.   Patient is a 28 year old male presenting with pain all over.  Patient arrives here with stable vital signs and is afebrile.  He is concerned that his ketones are elevated.  Physical examination here is unremarkable.  Laboratory studies obtained including CBC and basic metabolic panel, both of which are unremarkable.  Urinalysis shows 15 ketones, but is otherwise unremarkable.  Patient hydrated with normal saline.  At this point, I I am uncertain as to what is causing his symptoms, but nothing appears emergent.  Patient is clinically well-appearing and in no distress.  His vital signs are stable and workup is reassuring.  He has a mild level of ketones in his urine, the significance of which I am uncertain.  I have advised follow-up with primary doctor to discuss further if symptoms persist.     Final diagnoses:  None    ED Discharge Orders     None          Geroldine Berg, MD 01/18/24 [phone]

## 2024-01-17 NOTE — ED Triage Notes (Signed)
 Pt reports having generalized body pain and reports the last time this happened it was due to elevated ketone levels. Pt denies any diagnosis of diabetes.

## 2024-01-18 LAB — BASIC METABOLIC PANEL WITH GFR
Anion gap: 12 (ref 5–15)
BUN: 25 mg/dL — ABNORMAL HIGH (ref 6–20)
CO2: 25 mmol/L (ref 22–32)
Calcium: 10 mg/dL (ref 8.9–10.3)
Chloride: 103 mmol/L (ref 98–111)
Creatinine, Ser: 1.14 mg/dL (ref 0.61–1.24)
GFR, Estimated: >60 mL/min (ref >60)
Glucose, Bld: 88 mg/dL (ref 70–99)
Potassium: 4.2 mmol/L (ref 3.5–5.1)
Sodium: 141 mmol/L (ref 135–145)

## 2024-01-18 LAB — URINALYSIS, ROUTINE W REFLEX MICROSCOPIC
Bilirubin Urine: NEGATIVE
Glucose, UA: NEGATIVE mg/dL
Hgb urine dipstick: NEGATIVE
Ketones, ur: 15 mg/dL — AB
Leukocytes,Ua: NEGATIVE
Nitrite: NEGATIVE
Protein, ur: NEGATIVE mg/dL
Specific Gravity, Urine: 1.031 — ABNORMAL HIGH (ref 1.005–1.030)
pH: 6.5 (ref 5.0–8.0)

## 2024-01-18 LAB — CBC WITH DIFFERENTIAL/PLATELET
Abs Immature Granulocytes: 0.02 K/uL (ref 0.00–0.07)
Basophils Absolute: 0 K/uL (ref 0.0–0.1)
Basophils Relative: 0 %
Eosinophils Absolute: 0 K/uL (ref 0.0–0.5)
Eosinophils Relative: 1 %
HCT: 43.3 % (ref 39.0–52.0)
Hemoglobin: 14.3 g/dL (ref 13.0–17.0)
Immature Granulocytes: 0 %
Lymphocytes Relative: 22 %
Lymphs Abs: 1.4 K/uL (ref 0.7–4.0)
MCH: 28.4 pg (ref 26.0–34.0)
MCHC: 33 g/dL (ref 30.0–36.0)
MCV: 85.9 fL (ref 80.0–100.0)
Monocytes Absolute: 0.5 K/uL (ref 0.1–1.0)
Monocytes Relative: 7 %
Neutro Abs: 4.5 K/uL (ref 1.7–7.7)
Neutrophils Relative %: 70 %
Platelets: 242 K/uL (ref 150–400)
RBC: 5.04 MIL/uL (ref 4.22–5.81)
RDW: 12.2 % (ref 11.5–15.5)
WBC: 6.5 K/uL (ref 4.0–10.5)
nRBC: 0 % (ref 0.0–0.2)

## 2024-01-18 NOTE — Discharge Instructions (Signed)
 Take ibuprofen 600 mg every 6 hours as needed for pain.  Drink plenty of fluids and get plenty of rest.  Follow-up with your primary doctor in the next week for a recheck, and return to the ER if symptoms significantly worsen or change.

## 2024-01-19 LAB — CBG MONITORING, ED: Glucose-Capillary: 94 mg/dL (ref 70–99)

## 2024-01-21 ENCOUNTER — Ambulatory Visit (HOSPITAL_BASED_OUTPATIENT_CLINIC_OR_DEPARTMENT_OTHER): Attending: Family Medicine | Admitting: Physical Therapy

## 2024-01-21 ENCOUNTER — Encounter (HOSPITAL_BASED_OUTPATIENT_CLINIC_OR_DEPARTMENT_OTHER): Payer: Self-pay | Admitting: Physical Therapy

## 2024-01-21 DIAGNOSIS — G8929 Other chronic pain: Secondary | ICD-10-CM | POA: Insufficient documentation

## 2024-01-21 DIAGNOSIS — M79604 Pain in right leg: Secondary | ICD-10-CM | POA: Diagnosis not present

## 2024-01-21 DIAGNOSIS — M545 Low back pain, unspecified: Secondary | ICD-10-CM | POA: Diagnosis present

## 2024-01-21 DIAGNOSIS — M5459 Other low back pain: Secondary | ICD-10-CM

## 2024-01-21 DIAGNOSIS — M79605 Pain in left leg: Secondary | ICD-10-CM | POA: Insufficient documentation

## 2024-01-21 DIAGNOSIS — M6281 Muscle weakness (generalized): Secondary | ICD-10-CM | POA: Insufficient documentation

## 2024-01-21 DIAGNOSIS — R29898 Other symptoms and signs involving the musculoskeletal system: Secondary | ICD-10-CM

## 2024-01-21 NOTE — Therapy (Signed)
 OUTPATIENT PHYSICAL THERAPY THORACOLUMBAR EVALUATION   Patient Name: Kevin [name] MRN: [id number] DOB:July 17, 1995, 28 y.o., male Today's Date: 01/22/2024  END OF SESSION:  PT End of Session - 01/21/24 0840     Visit Number 1    Number of Visits 12    Date for PT Re-Evaluation 03/03/24    Authorization Type [facility]    PT Start Time [phone]   Pt arrived late to treatment   PT Stop Time 0854    PT Time Calculation (min) 38 min    Activity Tolerance --   Pt had increased pain durin eval.   Behavior During Therapy [facility] for tasks assessed/performed          Past Medical History:  Diagnosis Date   Bipolar depression (HCC)    Crohn's colitis (HCC)    Thrombosed hemorrhoids    Past Surgical History:  Procedure Laterality Date   APPENDECTOMY     Patient Active Problem List   Diagnosis Date Noted   Bipolar affective disorder, currently depressed, mild (HCC) 11/25/2023   Gastroesophageal reflux disease 11/25/2023   Hypotension 11/25/2023   Other specified counseling 11/25/2023   Low back pain 11/25/2023   Cerumen impaction 11/25/2023   Solitary pulmonary nodule 11/15/2022   Pain in left wrist 01/16/2022   Insomnia 11/14/2021   Severe bipolar I disorder, most recent episode depressed (HCC) 11/14/2021   Closed fracture of left distal radius 06/06/2021   Unstable angina (HCC) 02/04/2021   Chest pain 02/04/2021   Alcohol use, unspecified with unspecified alcohol-induced disorder (HCC) 08/06/2017   Bipolar II disorder (HCC) 07/31/2017   Generalized enlarged lymph nodes 11/14/2016   Hypermetropia, bilateral 09/21/2016    PCP: de [place], Raymond J, MD  REFERRING PROVIDER: de [place], Raymond J, MD  REFERRING DIAG: M54.50 (ICD-10-CM) - Right-sided low back pain without sciatica, unspecified chronicity  Rationale for Evaluation and Treatment: Rehabilitation  THERAPY DIAG:  Other low back pain  Pain in right leg  Pain in left leg  Muscle weakness (generalized)  Other symptoms and  signs involving the musculoskeletal system  ONSET DATE: Chronic pain with exacerbation last year ;  PT order 11/25/23  SUBJECTIVE:                                                                                                                                                                                           SUBJECTIVE STATEMENT: Pt states his back pain started when he was in the [employer] and progressively worsened.  Last year, he fell at work on his L hip which exacerbated the pain.  He had pain down bilat LE's.  Pt fell at  speed skating practice a couple of Fridays ago.  Pt states he felt good for a couple of days, but the pain returned.  Pt saw a chiropractor and was manipulated.  He felt better for a couple of days afterwards.  MD note on 6/9 indicated if pt is not improving as expected, the plan would likely be to take x-rays and consider referral to spine specialist.   Pt went to ER on 7/27 for back and foot pain and returned to the ER on 8/1 due to generalized body pain.  He states he has high ketones and is going to follow up with PCP.  Pt went to the [facility] last week and had x rays.  Pt states they thought it was a slipped disc.     Pt states he feels good with sitting.  Pt states he can squat down fine, but he is limited with bending due to pain.  He feels like his back may give out with bending.  He is unable to bend over to pick up an object.  Pt has pain lifting leg to dress.  Pt unable to speed skate.  Pt has pain with work activities.  Pt unable to lift a bag of dog food from the floor.  Pt states he vomits sometimes because he hurts so bad.    PERTINENT HISTORY:  Chrohn's, Bipolar disorder, depression Hx of high ketones  PAIN:  NPRS:  7/10 current, >10/10 worst, 4/10 best Location:  central lower lumbar which travels down bilat LE's,  pain more in R hip than L hip Aggravating factors:  laying down, bending Easing Factors:  sitting.  Pt's friend used a massage gun on his back  which felt a lot better. Aggravating factors:     PRECAUTIONS: None    WEIGHT BEARING RESTRICTIONS: No  FALLS:  Has patient fallen in last 6 months? Yes. Number of falls 1, speed skating as mentioned above  LIVING ENVIRONMENT: Lives with: lives alone Lives in: apartment Stairs: 3 flights of stairs   OCCUPATION: Pt works in Scient[employer], [employer].    PLOF: Independent  Pt speed skates daily typically.    PATIENT GOALS: to improve pain, to be able to speed skate at a competitive level   OBJECTIVE:  Note: Objective measures were completed at Evaluation unless otherwise noted.  DIAGNOSTIC FINDINGS:  Lumbar x rays on 7/27: IMPRESSION: 1. Straightening of the lumbar spine may relate to underlying muscular spasm. No acute fracture or traumatic listhesis.  PATIENT SURVEYS:  Modified Oswestry:  MODIFIED OSWESTRY DISABILITY SCALE  Date: 01/21/2024 Score  Pain intensity 4 =  Pain medication provides me with little relief from pain.  2. Personal care (washing, dressing, etc.) 5 =  I do not get dressed, wash with difficulty, and stay in bed.  3. Lifting 5 =  I cannot lift or carry anything at all.  4. Walking 2 =  Pain prevents me from walking more than  mile.  5. Sitting 2 =  Pain prevents me from sitting more than 1 hour.  6. Standing 4 =  Pain prevents me from standing more than 10 minutes.  7. Sleeping 4 =  Even when I take pain medication, I sleep less than 2 hour  8. Social Life 5 =  I have hardly any social life because of my pain.  9. Traveling 4 = My pain restricts my travel to short necessary journeys under 1/2 hour.  10. Employment/ Homemaking 4 = Pain prevents me from doing even  light duties.  Total 39/50= 78%   Interpretation of scores: Score Category Description  0-20% Minimal Disability The patient can cope with most living activities. Usually no treatment is indicated apart from advice on lifting, sitting and exercise  21-40% Moderate Disability The patient experiences  more pain and difficulty with sitting, lifting and standing. Travel and social life are more difficult and they may be disabled from work. Personal care, sexual activity and sleeping are not grossly affected, and the patient can usually be managed by conservative means  41-60% Severe Disability Pain remains the main problem in this group, but activities of daily living are affected. These patients require a detailed investigation  61-80% Crippled Back pain impinges on all aspects of the patient's life. Positive intervention is required  81-100% Bed-bound  These patients are either bed-bound or exaggerating their symptoms  Bluford FORBES Zoe DELENA Karon DELENA, et al. Surgery versus conservative management of stable thoracolumbar fracture: the PRESTO feasibility RCT. [place] ([place]): [employer]; 2021 Nov. [facility] Technology Assessment, No. 25.62.) Appendix 3, Oswestry Disability Index category descriptors. Available from: [employer]cz  Minimally Clinically Important Difference (MCID) = 12.8%  COGNITION: Overall cognitive status: Within functional limits for tasks assessed     SENSATION: Check next visit    LUMBAR ROM:   AROM eval  Flexion 25% with pain  Extension 75%  Right lateral flexion 75%  Left lateral flexion 75%  Right rotation 90%  Left rotation 50% with pain   (Blank rows = not tested)   LOWER EXTREMITY MMT:    MMT Right eval Left eval  Hip flexion 5/5 5/5  Hip extension    Hip abduction    Hip adduction    Hip internal rotation    Hip external rotation    Knee flexion    Knee extension 4-/5 with pain 4-/5 with pain, pain down L LE  Ankle dorsiflexion 5/5 Tol min resistance  Ankle plantarflexion WFL seated weak  Ankle inversion    Ankle eversion     (Blank rows = not tested)  LUMBAR SPECIAL TESTS:  Slump test:  positive   Pt performed Prone lying x 1 min, Prop up < 1 min.  Pt had increased lumbar pain and felt  nauseated.  GAIT: Assistive device utilized: None Level of assistance: Complete Independence Comments: WFL, no limp  TREATMENT:                                                                                                                                PT did not provide HEP today.  PT limited with time due to pt arriving late.   PATIENT EDUCATION:  Education details: *** Person educated: {Person educated:25204} Education method: {Education Method:25205} Education comprehension: {Education Comprehension:25206}  HOME EXERCISE PROGRAM: Will give next visit  ASSESSMENT:  CLINICAL IMPRESSION: Patient is a 28 y.o. male with a dx of Right-sided low back pain without sciatica.  Pt has a hx of  chronic pain with an exacerbation of pain last year when he fell at work.  Pt fell at speed skating practice a couple of Fridays ago.  Pt has been to the ER recently and the [facility] due to pain.  Pt has high levels of pain and reports he can become nauseated and vomit sometimes due to the pain.  He c/o's of central lower lumbar pain which travels down bilat LE's.     Muscle weakness primarily in L LE, limited lumbar ROM  Pt states he feels good with sitting.  Pt states he can squat down fine, but he is limited with bending due to pain.  He feels like his back may give out with bending.  He is unable to bend over to pick up an object.  Pt has pain lifting leg to dress.  Pt unable to speed skate.  Pt has pain with work activities.  Pt unable to lift a bag of dog food from the floor.   OBJECTIVE IMPAIRMENTS: {opptimpairments:25111}.   ACTIVITY LIMITATIONS: {activitylimitations:27494}  PARTICIPATION LIMITATIONS: {participationrestrictions:25113}  PERSONAL FACTORS: {Personal factors:25162} are also affecting patient's functional outcome.   REHAB POTENTIAL: {rehabpotential:25112}  CLINICAL DECISION MAKING: {clinical decision making:25114}  EVALUATION COMPLEXITY: {Evaluation  complexity:25115}   GOALS:    SHORT TERM GOALS: Target date:  02/11/24    *** Baseline: Goal status: INITIAL  2.  *** Baseline:  Goal status: INITIAL  3.  *** Baseline:  Goal status: INITIAL  4.  *** Baseline:  Goal status: INITIAL  5.  *** Baseline:  Goal status: INITIAL  6.  *** Baseline:  Goal status: INITIAL  LONG TERM GOALS: Target date: 03/03/24  *** Baseline:  Goal status: INITIAL  2.  *** Baseline:  Goal status: INITIAL  3.  *** Baseline:  Goal status: INITIAL  4.  *** Baseline:  Goal status: INITIAL  5.  *** Baseline:  Goal status: INITIAL  6.  *** Baseline:  Goal status: INITIAL  PLAN:  PT FREQUENCY: 2x/week  PT DURATION: 6 weeks  PLANNED INTERVENTIONS: [id number]- PT Re-evaluation, 97750- Physical Performance Testing, 97110-Therapeutic exercises, 97530- Therapeutic activity, 97112- Neuromuscular re-education, 97535- Self Care, [id number]- Manual therapy, [id number]- Gait training, [id number]- Aquatic Therapy, [id number]- Electrical stimulation (unattended), [id number]- Electrical stimulation (manual), [id number]- Ultrasound, Patient/Family education, Balance training, Stair training, Taping, Joint mobilization, Spinal mobilization, Cryotherapy, and Moist heat.  PLAN FOR NEXT SESSION: Check SLR test and sensation to LT in LE dermatomes next visit.  Assess HS flexibility.     Mose Minerva, PT 01/22/2024, 2:04 PM

## 2024-01-22 ENCOUNTER — Ambulatory Visit (HOSPITAL_BASED_OUTPATIENT_CLINIC_OR_DEPARTMENT_OTHER): Admitting: Family Medicine

## 2024-01-24 ENCOUNTER — Other Ambulatory Visit: Payer: Self-pay | Admitting: Medical Genetics

## 2024-01-28 ENCOUNTER — Telehealth (HOSPITAL_BASED_OUTPATIENT_CLINIC_OR_DEPARTMENT_OTHER): Payer: Self-pay

## 2024-01-28 ENCOUNTER — Ambulatory Visit (HOSPITAL_BASED_OUTPATIENT_CLINIC_OR_DEPARTMENT_OTHER)

## 2024-01-28 NOTE — Telephone Encounter (Signed)
 Called pt and left VM regarding missed appt.

## 2024-01-30 ENCOUNTER — Ambulatory Visit (HOSPITAL_BASED_OUTPATIENT_CLINIC_OR_DEPARTMENT_OTHER): Admitting: Physical Therapy

## 2024-01-30 ENCOUNTER — Encounter (HOSPITAL_BASED_OUTPATIENT_CLINIC_OR_DEPARTMENT_OTHER): Payer: Self-pay | Admitting: Physical Therapy

## 2024-01-30 DIAGNOSIS — M545 Low back pain, unspecified: Secondary | ICD-10-CM | POA: Diagnosis not present

## 2024-01-30 DIAGNOSIS — R29898 Other symptoms and signs involving the musculoskeletal system: Secondary | ICD-10-CM

## 2024-01-30 DIAGNOSIS — M5459 Other low back pain: Secondary | ICD-10-CM

## 2024-01-30 DIAGNOSIS — M6281 Muscle weakness (generalized): Secondary | ICD-10-CM

## 2024-01-30 DIAGNOSIS — M79605 Pain in left leg: Secondary | ICD-10-CM

## 2024-01-30 DIAGNOSIS — M79604 Pain in right leg: Secondary | ICD-10-CM

## 2024-01-30 NOTE — Therapy (Signed)
 OUTPATIENT PHYSICAL THERAPY TREATMENT   Patient Name: Kevin [name] MRN: [id number] DOB:[birth year], 28 y.o., male Today's Date: 01/30/2024  END OF SESSION:  PT End of Session - 01/30/24 1017     Visit Number 2    Number of Visits 12    Date for PT Re-Evaluation 03/03/24    Authorization Type [facility]    PT Start Time 1017    PT Stop Time 1057    PT Time Calculation (min) 40 min    Activity Tolerance Patient tolerated treatment well    Behavior During Therapy WFL for tasks assessed/performed          Past Medical History:  Diagnosis Date   Bipolar depression (HCC)    Crohn's colitis (HCC)    Thrombosed hemorrhoids    Past Surgical History:  Procedure Laterality Date   APPENDECTOMY     Patient Active Problem List   Diagnosis Date Noted   Bipolar affective disorder, currently depressed, mild (HCC) 11/25/2023   Gastroesophageal reflux disease 11/25/2023   Hypotension 11/25/2023   Other specified counseling 11/25/2023   Low back pain 11/25/2023   Cerumen impaction 11/25/2023   Solitary pulmonary nodule 11/15/2022   Pain in left wrist 01/16/2022   Insomnia 11/14/2021   Severe bipolar I disorder, most recent episode depressed (HCC) 11/14/2021   Closed fracture of left distal radius 06/06/2021   Unstable angina (HCC) 02/04/2021   Chest pain 02/04/2021   Alcohol use, unspecified with unspecified alcohol-induced disorder (HCC) 08/06/2017   Bipolar II disorder (HCC) 07/31/2017   Generalized enlarged lymph nodes 11/14/2016   Hypermetropia, bilateral 09/21/2016    PCP: de [place], Raymond J, MD  REFERRING PROVIDER: de [place], Raymond J, MD  REFERRING DIAG: M54.50 (ICD-10-CM) - Right-sided low back pain without sciatica, unspecified chronicity  Rationale for Evaluation and Treatment: Rehabilitation  THERAPY DIAG:  Other low back pain  Pain in right leg  Pain in left leg  Muscle weakness (generalized)  Other symptoms and signs involving the musculoskeletal  system  ONSET DATE: Chronic pain with exacerbation last year ;  PT order 11/25/23  SUBJECTIVE:                                                                                                                                                                                           SUBJECTIVE STATEMENT: Patient states has been trying take it easy. Has been sleeping on the floor which seems to help back.    EVAL: Pt states his back pain started when he was in the [employer] and progressively worsened.  Last year, he fell at work on his L hip which exacerbated the  pain.  He had pain down bilat LE's.  Pt fell at speed skating practice a couple of Fridays ago.  Pt states he felt good for a couple of days, but the pain returned.  Pt saw a chiropractor and was manipulated.  He felt better for a couple of days afterwards.  MD note on 6/9 indicated if pt is not improving as expected, the plan would likely be to take x-rays and consider referral to spine specialist.   Pt went to ER on 7/27 for back and foot pain and returned to the ER on 8/1 due to generalized body pain.  He states he has high ketones and is going to follow up with PCP.  Pt went to the [facility] last week and had x rays.  Pt states they thought it was a slipped disc.    Pt states he feels good with sitting.  Pt states he can squat down fine, but he is limited with bending due to pain.  He feels like his back may give out with bending.  He is unable to bend over to pick up an object.  Pt has pain lifting leg to dress.  Pt unable to speed skate.  Pt has pain with work activities.  Pt unable to lift a bag of dog food from the floor.  Pt states he vomits sometimes because he hurts so bad.    PERTINENT HISTORY:  Chrohn's, Bipolar disorder, depression Hx of high ketones  PAIN:  NPRS:  4/10 current, >10/10 worst, 4/10 best Location:  central lower lumbar which travels down bilat LE's,  pain more in R hip than L hip Aggravating factors:  laying down,  bending Easing Factors:  sitting.  Pt's friend used a massage gun on his back which felt a lot better. Aggravating factors:  bending    PRECAUTIONS: None    WEIGHT BEARING RESTRICTIONS: No  FALLS:  Has patient fallen in last 6 months? Yes. Number of falls 1, speed skating as mentioned above  LIVING ENVIRONMENT: Lives with: lives alone Lives in: apartment Stairs: 3 flights of stairs   OCCUPATION: Pt works in Scient[employer], [employer].    PLOF: Independent  Pt speed skates daily typically.    PATIENT GOALS: to improve pain, to be able to speed skate at a competitive level   OBJECTIVE:  Note: Objective measures were completed at Evaluation unless otherwise noted.  DIAGNOSTIC FINDINGS:  Lumbar x rays on 7/27: IMPRESSION: 1. Straightening of the lumbar spine may relate to underlying muscular spasm. No acute fracture or traumatic listhesis.  PATIENT SURVEYS:  Modified Oswestry:  MODIFIED OSWESTRY DISABILITY SCALE  Date: 01/21/2024 Score  Pain intensity 4 =  Pain medication provides me with little relief from pain.  2. Personal care (washing, dressing, etc.) 5 =  I do not get dressed, wash with difficulty, and stay in bed.  3. Lifting 5 =  I cannot lift or carry anything at all.  4. Walking 2 =  Pain prevents me from walking more than  mile.  5. Sitting 2 =  Pain prevents me from sitting more than 1 hour.  6. Standing 4 =  Pain prevents me from standing more than 10 minutes.  7. Sleeping 4 =  Even when I take pain medication, I sleep less than 2 hour  8. Social Life 5 =  I have hardly any social life because of my pain.  9. Traveling 4 = My pain restricts my travel to short necessary journeys under 1/2 hour.  10. Employment/ Homemaking 4 = Pain prevents me from doing even light duties.  Total 39/50= 78%   Interpretation of scores: Score Category Description  0-20% Minimal Disability The patient can cope with most living activities. Usually no treatment is indicated apart from advice  on lifting, sitting and exercise  21-40% Moderate Disability The patient experiences more pain and difficulty with sitting, lifting and standing. Travel and social life are more difficult and they may be disabled from work. Personal care, sexual activity and sleeping are not grossly affected, and the patient can usually be managed by conservative means  41-60% Severe Disability Pain remains the main problem in this group, but activities of daily living are affected. These patients require a detailed investigation  61-80% Crippled Back pain impinges on all aspects of the patient's life. Positive intervention is required  81-100% Bed-bound  These patients are either bed-bound or exaggerating their symptoms  Bluford FORBES Zoe DELENA Karon DELENA, et al. Surgery versus conservative management of stable thoracolumbar fracture: the PRESTO feasibility RCT. [place] ([place]): [employer]; 2021 Nov. [facility] Technology Assessment, No. 25.62.) Appendix 3, Oswestry Disability Index category descriptors. Available from: FindJewel[employer]cz  Minimally Clinically Important Difference (MCID) = 12.8%  COGNITION: Overall cognitive status: Within functional limits for tasks assessed     SENSATION: WFL    LUMBAR ROM:   AROM eval  Flexion 25% with pain  Extension 75%  Right lateral flexion 75%  Left lateral flexion 75%  Right rotation 90%  Left rotation 50% with pain   (Blank rows = not tested)   LOWER EXTREMITY MMT:    MMT Right eval Left eval  Hip flexion 5/5 5/5  Hip extension    Hip abduction    Hip adduction    Hip internal rotation    Hip external rotation    Knee flexion    Knee extension 4-/5 with pain 4-/5 with pain, pain down L LE  Ankle dorsiflexion 5/5 Tol min resistance  Ankle plantarflexion WFL seated weak  Ankle inversion    Ankle eversion     (Blank rows = not tested)  LUMBAR SPECIAL TESTS:  Slump test:  positive   -Pt performed Prone lying x  1 min, Prop up < 1 min.  Pt had increased lumbar pain and felt nauseated.  PT had pt sit up and rest.  Pt sat in chair and felt better after resting.   GAIT: Assistive device utilized: None Level of assistance: Complete Independence Comments: WFL, no limp  TREATMENT:                                                                                                                               01/30/24 LTR 10 x 5 second holds Supine ab set 10 x 5 second holds Supine active hamstring stretch 5 x 10 second Bridge 2 x 10 March with ab set 2 x 10 SLR 1 x 10 Prone hip extension 2 x 10  Manual: STM to lumbar paraspinals, grade II R UPA Prone press up 2 x 10   PATIENT EDUCATION:  Education details: relevant anatomy, rationale of interventions, POC, dx, objective findings, and what to expect next treatment.  PT answered pt's questions.  Person educated: Patient Education method: Explanation Education comprehension: verbalized understanding and needs further education  HOME EXERCISE PROGRAM: Access Code: [email or web address] URL: [email or web address] Date: 01/30/2024 Prepared by: Prentice Loney Domingo  Exercises - Supine Lower Trunk Rotation  - 1 x daily - 7 x weekly - 2 sets - 10 reps - Supine Bridge  - 1 x daily - 7 x weekly - 2 sets - 10 reps - Abdominal Bracing  - 1 x daily - 7 x weekly - 2 sets - 10 reps - 5-10 second hold - Supine March  - 1 x daily - 7 x weekly - 2 sets - 10 reps - Supine Active Straight Leg Raise  - 1 x daily - 7 x weekly - 2 sets - 10 reps  ASSESSMENT:  CLINICAL IMPRESSION: LE sensation WFL, hamstring length mildly limited. Began spinal mobility and core/hip strengthening which is tolerated well. Patient tender and hyperactive in lumbar paraspinals. Manual to lower lumbar region with decrease in tissue tension. Cueing provided for proper mechanics and activation throughout session. Patient will continue to benefit from physical therapy in order to improve function  and reduce impairment.    EVAL: Patient is a 29 y.o. male with a dx of Right-sided low back pain without sciatica.  Pt has a hx of chronic pain with an exacerbation of pain last year when he fell at work.  Pt fell at speed skating practice a couple of Fridays ago.  Pt has been to the ER recently and the [facility] due to pain.  Pt has high levels of pain and reports he can become nauseated and vomit sometimes due to the pain.  He c/o's of central lower lumbar pain which travels down bilat LE's.  Pt has weakness in L LE and R knee extension.  Pt demonstrates limited ROM in lumbar.  Pt states he feels good with sitting.  He has pain and limitations with bending due to pain and feels like his back may give out.  He is unable to bend over to pick up an object.  He enjoys and competes in speed skating though is unable to currently.  He also has pain with work activities.  Pt should benefit from skilled PT to address impairments and to improve overall function..   OBJECTIVE IMPAIRMENTS: decreased activity tolerance, decreased mobility, decreased ROM, decreased strength, hypomobility, and pain.   ACTIVITY LIMITATIONS: carrying, lifting, bending, and dressing  PARTICIPATION LIMITATIONS: cleaning, community activity, and yard work  PERSONAL FACTORS: Time since onset of injury/illness/exacerbation are also affecting patient's functional outcome.   REHAB POTENTIAL: Good  CLINICAL DECISION MAKING: Stable/uncomplicated  EVALUATION COMPLEXITY: Low   GOALS:    SHORT TERM GOALS: Target date:  02/11/24    Pt will be independent and compliant with HEP for improved pain, tolerance to activity, strength, ROM, and function. Baseline: Goal status: INITIAL  2.  Pt will report his worst pain being no > than 9/10. Baseline:  Goal status: INITIAL  3.  Pt will report at least a 25% improvement in pain and sx's overall.   Baseline:  Goal status: INITIAL  4.  Pt will demonstrate lumbar extension, Sb'ing and  rotation to be [facility] bilat for improved daily mobility and stiffness. Baseline:  Goal status: INITIAL Target  date:  02/18/2024   5.  Pt will score at least a 65% on the modified Oswestry for clinically significant improvement in self perceived disability and function.  Baseline:  Goal status: INITIAL    LONG TERM GOALS: Target date: 03/03/24  Pt will demo improved LE strength to 5/5 in bilat knee extension and L ankle DF and to [facility] for PF seated for improved performance of and tolerance with functional mobility and to assist with returning to recreational/sporting activities.   Baseline:  Goal status: INITIAL  2.  Pt will demo proper body mechanics with squat to lift/hip hinge for improved performance of functional movements and functional lifting with decreased stress and strain on lumbar.  Baseline:  Goal status: INITIAL  3.  Pt will report at least a 70% improvement in pain and sx's overall.  Baseline:  Goal status: INITIAL  4.  Pt will be able to perform his work activities without significant pain and difficulty. Baseline:  Goal status: INITIAL  5.  Pt will be able to dress without increased pain. Baseline:  Goal status: INITIAL    PLAN:  PT FREQUENCY: 2x/week  PT DURATION: 6 weeks  PLANNED INTERVENTIONS: [id number]- PT Re-evaluation, 97750- Physical Performance Testing, 97110-Therapeutic exercises, 97530- Therapeutic activity, [id number]- Neuromuscular re-education, 97535- Self Care, [id number]- Manual therapy, [phone]- Gait training, [phone]- Aquatic Therapy, [phone]- Electrical stimulation (unattended), [phone]- Electrical stimulation (manual), [id number]- Ultrasound, Patient/Family education, Balance training, Stair training, Taping, Joint mobilization, Spinal mobilization, Cryotherapy, and Moist heat.  PLAN FOR NEXT SESSION: Continue Core/hip  strengthening.  Assess soft tissue tightness and trigger points.   Prentice RAMAN Gilberte Gorley, PT, DPT 01/30/2024, 10:17 AM

## 2024-02-04 ENCOUNTER — Telehealth (HOSPITAL_BASED_OUTPATIENT_CLINIC_OR_DEPARTMENT_OTHER): Payer: Self-pay | Admitting: Physical Therapy

## 2024-02-04 ENCOUNTER — Ambulatory Visit (HOSPITAL_BASED_OUTPATIENT_CLINIC_OR_DEPARTMENT_OTHER): Admitting: Physical Therapy

## 2024-02-04 NOTE — Telephone Encounter (Signed)
 Patient no show, spoke with patient who stated he was not aware of this appointment and that his print out did not have Tuesday appointments listed. Reminded of next appointment.  8:39 AM, 02/04/24 Prentice CANDIE Stains PT, DPT Physical Therapist at [facility]

## 2024-02-06 ENCOUNTER — Ambulatory Visit (HOSPITAL_BASED_OUTPATIENT_CLINIC_OR_DEPARTMENT_OTHER)

## 2024-02-06 ENCOUNTER — Encounter (HOSPITAL_BASED_OUTPATIENT_CLINIC_OR_DEPARTMENT_OTHER): Payer: Self-pay

## 2024-02-06 DIAGNOSIS — M79604 Pain in right leg: Secondary | ICD-10-CM

## 2024-02-06 DIAGNOSIS — M6281 Muscle weakness (generalized): Secondary | ICD-10-CM

## 2024-02-06 DIAGNOSIS — M545 Low back pain, unspecified: Secondary | ICD-10-CM | POA: Diagnosis not present

## 2024-02-06 DIAGNOSIS — R29898 Other symptoms and signs involving the musculoskeletal system: Secondary | ICD-10-CM

## 2024-02-06 DIAGNOSIS — M79605 Pain in left leg: Secondary | ICD-10-CM

## 2024-02-06 DIAGNOSIS — M5459 Other low back pain: Secondary | ICD-10-CM

## 2024-02-06 NOTE — Therapy (Signed)
 OUTPATIENT PHYSICAL THERAPY TREATMENT   Patient Name: Kevin [name] MRN: [id number] DOB:Dec 09, 1995, 28 y.o., male Today's Date: 02/06/2024  END OF SESSION:  PT End of Session - 02/06/24 1436     Visit Number 3    Number of Visits 12    Date for PT Re-Evaluation 03/03/24    Authorization Type [facility]    PT Start Time 1435    PT Stop Time 1515    PT Time Calculation (min) 40 min    Activity Tolerance Patient tolerated treatment well    Behavior During Therapy WFL for tasks assessed/performed           Past Medical History:  Diagnosis Date   Bipolar depression (HCC)    Crohn's colitis (HCC)    Thrombosed hemorrhoids    Past Surgical History:  Procedure Laterality Date   APPENDECTOMY     Patient Active Problem List   Diagnosis Date Noted   Bipolar affective disorder, currently depressed, mild (HCC) 11/25/2023   Gastroesophageal reflux disease 11/25/2023   Hypotension 11/25/2023   Other specified counseling 11/25/2023   Low back pain 11/25/2023   Cerumen impaction 11/25/2023   Solitary pulmonary nodule 11/15/2022   Pain in left wrist 01/16/2022   Insomnia 11/14/2021   Severe bipolar I disorder, most recent episode depressed (HCC) 11/14/2021   Closed fracture of left distal radius 06/06/2021   Unstable angina (HCC) 02/04/2021   Chest pain 02/04/2021   Alcohol use, unspecified with unspecified alcohol-induced disorder (HCC) 08/06/2017   Bipolar II disorder (HCC) 07/31/2017   Generalized enlarged lymph nodes 11/14/2016   Hypermetropia, bilateral 09/21/2016    PCP: de [place], Raymond J, MD  REFERRING PROVIDER: de [place], Raymond J, MD  REFERRING DIAG: M54.50 (ICD-10-CM) - Right-sided low back pain without sciatica, unspecified chronicity  Rationale for Evaluation and Treatment: Rehabilitation  THERAPY DIAG:  Other symptoms and signs involving the musculoskeletal system  Other low back pain  Pain in right leg  Pain in left leg  Muscle weakness  (generalized)  ONSET DATE: Chronic pain with exacerbation last year ;  PT order 11/25/23  SUBJECTIVE:                                                                                                                                                                                           SUBJECTIVE STATEMENT: Pt reports no pain at entry. It seems to be doing better overall. Got a new bed recently which has been helping. Does have onset of pain when walking/standing for longer than 15 minutes.    EVAL: Pt states his back pain started when he was in the [employer] and  progressively worsened.  Last year, he fell at work on his L hip which exacerbated the pain.  He had pain down bilat LE's.  Pt fell at speed skating practice a couple of Fridays ago.  Pt states he felt good for a couple of days, but the pain returned.  Pt saw a chiropractor and was manipulated.  He felt better for a couple of days afterwards.  MD note on 6/9 indicated if pt is not improving as expected, the plan would likely be to take x-rays and consider referral to spine specialist.   Pt went to ER on 7/27 for back and foot pain and returned to the ER on 8/1 due to generalized body pain.  He states he has high ketones and is going to follow up with PCP.  Pt went to the [facility] last week and had x rays.  Pt states they thought it was a slipped disc.    Pt states he feels good with sitting.  Pt states he can squat down fine, but he is limited with bending due to pain.  He feels like his back may give out with bending.  He is unable to bend over to pick up an object.  Pt has pain lifting leg to dress.  Pt unable to speed skate.  Pt has pain with work activities.  Pt unable to lift a bag of dog food from the floor.  Pt states he vomits sometimes because he hurts so bad.    PERTINENT HISTORY:  Chrohn's, Bipolar disorder, depression Hx of high ketones  PAIN:  NPRS:  0/10 current, >10/10 worst, 4/10 best Location:  central lower lumbar which  travels down bilat LE's,  pain more in R hip than L hip Aggravating factors:  laying down, bending Easing Factors:  sitting.  Pt's friend used a massage gun on his back which felt a lot better. Aggravating factors:  bending    PRECAUTIONS: None    WEIGHT BEARING RESTRICTIONS: No  FALLS:  Has patient fallen in last 6 months? Yes. Number of falls 1, speed skating as mentioned above  LIVING ENVIRONMENT: Lives with: lives alone Lives in: apartment Stairs: 3 flights of stairs   OCCUPATION: Pt works in Scient[employer], [employer].    PLOF: Independent  Pt speed skates daily typically.    PATIENT GOALS: to improve pain, to be able to speed skate at a competitive level   OBJECTIVE:  Note: Objective measures were completed at Evaluation unless otherwise noted.  DIAGNOSTIC FINDINGS:  Lumbar x rays on 7/27: IMPRESSION: 1. Straightening of the lumbar spine may relate to underlying muscular spasm. No acute fracture or traumatic listhesis.  PATIENT SURVEYS:  Modified Oswestry:  MODIFIED OSWESTRY DISABILITY SCALE  Date: 01/21/2024 Score  Pain intensity 4 =  Pain medication provides me with little relief from pain.  2. Personal care (washing, dressing, etc.) 5 =  I do not get dressed, wash with difficulty, and stay in bed.  3. Lifting 5 =  I cannot lift or carry anything at all.  4. Walking 2 =  Pain prevents me from walking more than  mile.  5. Sitting 2 =  Pain prevents me from sitting more than 1 hour.  6. Standing 4 =  Pain prevents me from standing more than 10 minutes.  7. Sleeping 4 =  Even when I take pain medication, I sleep less than 2 hour  8. Social Life 5 =  I have hardly any social life because of my pain.  9. Traveling 4 = My pain restricts my travel to short necessary journeys under 1/2 hour.  10. Employment/ Homemaking 4 = Pain prevents me from doing even light duties.  Total 39/50= 78%   Interpretation of scores: Score Category Description  0-20% Minimal Disability The  patient can cope with most living activities. Usually no treatment is indicated apart from advice on lifting, sitting and exercise  21-40% Moderate Disability The patient experiences more pain and difficulty with sitting, lifting and standing. Travel and social life are more difficult and they may be disabled from work. Personal care, sexual activity and sleeping are not grossly affected, and the patient can usually be managed by conservative means  41-60% Severe Disability Pain remains the main problem in this group, but activities of daily living are affected. These patients require a detailed investigation  61-80% Crippled Back pain impinges on all aspects of the patient's life. Positive intervention is required  81-100% Bed-bound  These patients are either bed-bound or exaggerating their symptoms  Bluford FORBES Zoe DELENA Karon DELENA, et al. Surgery versus conservative management of stable thoracolumbar fracture: the PRESTO feasibility RCT. [place] ([place]): [employer]; 2021 Nov. [facility] Technology Assessment, No. 25.62.) Appendix 3, Oswestry Disability Index category descriptors. Available from: FindJewel[employer].cz  Minimally Clinically Important Difference (MCID) = 12.8%  COGNITION: Overall cognitive status: Within functional limits for tasks assessed     SENSATION: WFL    LUMBAR ROM:   AROM eval  Flexion 25% with pain  Extension 75%  Right lateral flexion 75%  Left lateral flexion 75%  Right rotation 90%  Left rotation 50% with pain   (Blank rows = not tested)   LOWER EXTREMITY MMT:    MMT Right eval Left eval Right 8/21  Left 8/21  Hip flexion 5/5 5/5    Hip extension      Hip abduction   4-/5 4-/5  Hip adduction      Hip internal rotation      Hip external rotation      Knee flexion      Knee extension 4-/5 with pain 4-/5 with pain, pain down L LE    Ankle dorsiflexion 5/5 Tol min resistance    Ankle plantarflexion WFL seated weak     Ankle inversion      Ankle eversion       (Blank rows = not tested)  LUMBAR SPECIAL TESTS:  Slump test:  positive   -Pt performed Prone lying x 1 min, Prop up < 1 min.  Pt had increased lumbar pain and felt nauseated.  PT had pt sit up and rest.  Pt sat in chair and felt better after resting.   GAIT: Assistive device utilized: None Level of assistance: Complete Independence Comments: WFL, no limp  TREATMENT:  02/06/24  LTR 10 x 5 second holds Supine ab set 10 x 5 second holds Supine active hamstring stretch 5 x 10 second Bridge 2 x 10 Sidelying hip abduction  Cross legged QL Stretch 3x20seconds ea Bird dog Prone hip extension with knee flexed 2 x 10 Manual: STM to lumbar paraspinals Prone press up x10  01/30/24 LTR 10 x 5 second holds Supine ab set 10 x 5 second holds Supine active hamstring stretch 5 x 10 second Bridge 2 x 10 March with ab set 2 x 10 SLR 1 x 10 Prone hip extension 2 x 10 Manual: STM to lumbar paraspinals, grade II R UPA Prone press up 2 x 10   PATIENT EDUCATION:  Education details: relevant anatomy, rationale of interventions, POC, dx, objective findings, and what to expect next treatment.  PT answered pt's questions.  Person educated: Patient Education method: Explanation Education comprehension: verbalized understanding and needs further education  HOME EXERCISE PROGRAM: Access Code: [email or web address] URL: [email or web address] Date: 01/30/2024 Prepared by: Prentice Zaunegger  Exercises - Supine Lower Trunk Rotation  - 1 x daily - 7 x weekly - 2 sets - 10 reps - Supine Bridge  - 1 x daily - 7 x weekly - 2 sets - 10 reps - Abdominal Bracing  - 1 x daily - 7 x weekly - 2 sets - 10 reps - 5-10 second hold - Supine March  - 1 x daily - 7 x weekly - 2 sets - 10 reps - Supine Active Straight Leg Raise  - 1 x daily - 7  x weekly - 2 sets - 10 reps  ASSESSMENT:  CLINICAL IMPRESSION: Good tolerance for stretching and strengthening program. Worked on NMR of core with exercises.   With bird dog exercise he has tendency to rotate L hip which imporved with cuing. Pt significantly tight into R QL compared to L and instructed in cross legged stretch which he reported was helpful. Tested hip abduction strength at 4- bilaterally. Added s/l hip abduction to target this which he performed well with appropriate fatigue. Pain seems to be improving and better controlled. No difficulty observed with transfers. Reviewed lifting mechanics and positions to avoid. Provided pt with updated HEP with good understanding of tasks and frequency. Discussed adding dynamic warmup and stretching routine prior to workouts.     EVAL: Patient is a 28 y.o. male with a dx of Right-sided low back pain without sciatica.  Pt has a hx of chronic pain with an exacerbation of pain last year when he fell at work.  Pt fell at speed skating practice a couple of Fridays ago.  Pt has been to the ER recently and the [facility] due to pain.  Pt has high levels of pain and reports he can become nauseated and vomit sometimes due to the pain.  He c/o's of central lower lumbar pain which travels down bilat LE's.  Pt has weakness in L LE and R knee extension.  Pt demonstrates limited ROM in lumbar.  Pt states he feels good with sitting.  He has pain and limitations with bending due to pain and feels like his back may give out.  He is unable to bend over to pick up an object.  He enjoys and competes in speed skating though is unable to currently.  He also has pain with work activities.  Pt should benefit from skilled PT to address impairments and to improve overall function..   OBJECTIVE IMPAIRMENTS: decreased activity tolerance, decreased mobility, decreased ROM,  decreased strength, hypomobility, and pain.   ACTIVITY LIMITATIONS: carrying, lifting, bending, and  dressing  PARTICIPATION LIMITATIONS: cleaning, community activity, and yard work  PERSONAL FACTORS: Time since onset of injury/illness/exacerbation are also affecting patient's functional outcome.   REHAB POTENTIAL: Good  CLINICAL DECISION MAKING: Stable/uncomplicated  EVALUATION COMPLEXITY: Low   GOALS:    SHORT TERM GOALS: Target date:  02/11/24    Pt will be independent and compliant with HEP for improved pain, tolerance to activity, strength, ROM, and function. Baseline: Goal status: INITIAL  2.  Pt will report his worst pain being no > than 9/10. Baseline:  Goal status: INITIAL  3.  Pt will report at least a 25% improvement in pain and sx's overall.   Baseline:  Goal status: INITIAL  4.  Pt will demonstrate lumbar extension, Sb'ing and rotation to be [facility] bilat for improved daily mobility and stiffness. Baseline:  Goal status: INITIAL Target date:  02/18/2024   5.  Pt will score at least a 65% on the modified Oswestry for clinically significant improvement in self perceived disability and function.  Baseline:  Goal status: INITIAL    LONG TERM GOALS: Target date: 03/03/24  Pt will demo improved LE strength to 5/5 in bilat knee extension and L ankle DF and to [facility] for PF seated for improved performance of and tolerance with functional mobility and to assist with returning to recreational/sporting activities.   Baseline:  Goal status: INITIAL  2.  Pt will demo proper body mechanics with squat to lift/hip hinge for improved performance of functional movements and functional lifting with decreased stress and strain on lumbar.  Baseline:  Goal status: INITIAL  3.  Pt will report at least a 70% improvement in pain and sx's overall.  Baseline:  Goal status: INITIAL  4.  Pt will be able to perform his work activities without significant pain and difficulty. Baseline:  Goal status: INITIAL  5.  Pt will be able to dress without increased pain. Baseline:  Goal  status: INITIAL    PLAN:  PT FREQUENCY: 2x/week  PT DURATION: 6 weeks  PLANNED INTERVENTIONS: 97164- PT Re-evaluation, 97750- Physical Performance Testing, 97110-Therapeutic exercises, 97530- Therapeutic activity, [id number]- Neuromuscular re-education, 97535- Self Care, [id number]- Manual therapy, [phone]- Gait training, [phone]- Aquatic Therapy, [phone]- Electrical stimulation (unattended), [phone]- Electrical stimulation (manual), [id number]- Ultrasound, Patient/Family education, Balance training, Stair training, Taping, Joint mobilization, Spinal mobilization, Cryotherapy, and Moist heat.  PLAN FOR NEXT SESSION: Continue Core/hip  strengthening.  Assess soft tissue tightness and trigger points.   Asberry BRAVO Neta Upadhyay, PTA 02/06/2024, 4:22 PM

## 2024-02-10 ENCOUNTER — Other Ambulatory Visit

## 2024-02-11 ENCOUNTER — Ambulatory Visit (HOSPITAL_BASED_OUTPATIENT_CLINIC_OR_DEPARTMENT_OTHER): Admitting: Physical Therapy

## 2024-02-11 ENCOUNTER — Telehealth (HOSPITAL_BASED_OUTPATIENT_CLINIC_OR_DEPARTMENT_OTHER): Payer: Self-pay | Admitting: Physical Therapy

## 2024-02-11 NOTE — Telephone Encounter (Signed)
 Patient no show, left message for patient to make him aware of missed appointment and reminded him of the next appointment. Instructed him to cancel if unable to attend. Educated patient on attendance policy and will cancel all remaining PT appointments after the next one due to attendance policy.   9:08 AM, 02/11/24 Prentice CANDIE Stains PT, DPT Physical Therapist at [facility]

## 2024-02-13 ENCOUNTER — Encounter (HOSPITAL_BASED_OUTPATIENT_CLINIC_OR_DEPARTMENT_OTHER): Payer: Self-pay | Admitting: Physical Therapy

## 2024-02-13 ENCOUNTER — Ambulatory Visit (HOSPITAL_BASED_OUTPATIENT_CLINIC_OR_DEPARTMENT_OTHER): Admitting: Physical Therapy

## 2024-02-13 DIAGNOSIS — M79605 Pain in left leg: Secondary | ICD-10-CM

## 2024-02-13 DIAGNOSIS — R29898 Other symptoms and signs involving the musculoskeletal system: Secondary | ICD-10-CM

## 2024-02-13 DIAGNOSIS — M5459 Other low back pain: Secondary | ICD-10-CM

## 2024-02-13 DIAGNOSIS — M545 Low back pain, unspecified: Secondary | ICD-10-CM | POA: Diagnosis not present

## 2024-02-13 DIAGNOSIS — M79604 Pain in right leg: Secondary | ICD-10-CM

## 2024-02-13 DIAGNOSIS — M6281 Muscle weakness (generalized): Secondary | ICD-10-CM

## 2024-02-13 NOTE — Therapy (Signed)
 OUTPATIENT PHYSICAL THERAPY TREATMENT   Patient Name: Kevin [name] MRN: [id number] DOB:1996-02-25, 28 y.o., male Today's Date: 02/13/2024  END OF SESSION:  PT End of Session - 02/13/24 1436     Visit Number 4    Number of Visits 12    Date for PT Re-Evaluation 03/03/24    Authorization Type [facility]    PT Start Time 1436    PT Stop Time 1518    PT Time Calculation (min) 42 min    Activity Tolerance Patient tolerated treatment well    Behavior During Therapy [facility] for tasks assessed/performed           Past Medical History:  Diagnosis Date   Bipolar depression (HCC)    Crohn's colitis (HCC)    Thrombosed hemorrhoids    Past Surgical History:  Procedure Laterality Date   APPENDECTOMY     Patient Active Problem List   Diagnosis Date Noted   Bipolar affective disorder, currently depressed, mild (HCC) 11/25/2023   Gastroesophageal reflux disease 11/25/2023   Hypotension 11/25/2023   Other specified counseling 11/25/2023   Low back pain 11/25/2023   Cerumen impaction 11/25/2023   Solitary pulmonary nodule 11/15/2022   Pain in left wrist 01/16/2022   Insomnia 11/14/2021   Severe bipolar I disorder, most recent episode depressed (HCC) 11/14/2021   Closed fracture of left distal radius 06/06/2021   Unstable angina (HCC) 02/04/2021   Chest pain 02/04/2021   Alcohol use, unspecified with unspecified alcohol-induced disorder (HCC) 08/06/2017   Bipolar II disorder (HCC) 07/31/2017   Generalized enlarged lymph nodes 11/14/2016   Hypermetropia, bilateral 09/21/2016    PCP: de Peru, Raymond J, MD  REFERRING PROVIDER: de [place], Raymond J, MD  REFERRING DIAG: M54.50 (ICD-10-CM) - Right-sided low back pain without sciatica, unspecified chronicity  Rationale for Evaluation and Treatment: Rehabilitation  THERAPY DIAG:  Other symptoms and signs involving the musculoskeletal system  Other low back pain  Pain in right leg  Pain in left leg  Muscle weakness  (generalized)  ONSET DATE: Chronic pain with exacerbation last year ;  PT order 11/25/23  SUBJECTIVE:                                                                                                                                                                                           SUBJECTIVE STATEMENT: Pt reports back is doing much better. The cross leg stretch has been helpful.    EVAL: Pt states his back pain started when he was in the [employer] and progressively worsened.  Last year, he fell at work on his L hip which exacerbated the pain.  He had  pain down bilat LE's.  Pt fell at speed skating practice a couple of Fridays ago.  Pt states he felt good for a couple of days, but the pain returned.  Pt saw a chiropractor and was manipulated.  He felt better for a couple of days afterwards.  MD note on 6/9 indicated if pt is not improving as expected, the plan would likely be to take x-rays and consider referral to spine specialist.   Pt went to ER on 7/27 for back and foot pain and returned to the ER on 8/1 due to generalized body pain.  He states he has high ketones and is going to follow up with PCP.  Pt went to the [place] last week and had x rays.  Pt states they thought it was a slipped disc.    Pt states he feels good with sitting.  Pt states he can squat down fine, but he is limited with bending due to pain.  He feels like his back may give out with bending.  He is unable to bend over to pick up an object.  Pt has pain lifting leg to dress.  Pt unable to speed skate.  Pt has pain with work activities.  Pt unable to lift a bag of dog food from the floor.  Pt states he vomits sometimes because he hurts so bad.    PERTINENT HISTORY:  Chrohn's, Bipolar disorder, depression Hx of high ketones  PAIN:  NPRS:  0/10 current, >10/10 worst, 4/10 best Location:  central lower lumbar which travels down bilat LE's,  pain more in R hip than L hip Aggravating factors:  laying down, bending Easing Factors:   sitting.  Pt's friend used a massage gun on his back which felt a lot better. Aggravating factors:  bending    PRECAUTIONS: None    WEIGHT BEARING RESTRICTIONS: No  FALLS:  Has patient fallen in last 6 months? Yes. Number of falls 1, speed skating as mentioned above  LIVING ENVIRONMENT: Lives with: lives alone Lives in: apartment Stairs: 3 flights of stairs   OCCUPATION: Pt works in Scientist, forensic.    PLOF: Independent  Pt speed skates daily typically.    PATIENT GOALS: to improve pain, to be able to speed skate at a competitive level   OBJECTIVE:  Note: Objective measures were completed at Evaluation unless otherwise noted.  DIAGNOSTIC FINDINGS:  Lumbar x rays on 7/27: IMPRESSION: 1. Straightening of the lumbar spine may relate to underlying muscular spasm. No acute fracture or traumatic listhesis.  PATIENT SURVEYS:  Modified Oswestry:  MODIFIED OSWESTRY DISABILITY SCALE  Date: 01/21/2024 Score  Pain intensity 4 =  Pain medication provides me with little relief from pain.  2. Personal care (washing, dressing, etc.) 5 =  I do not get dressed, wash with difficulty, and stay in bed.  3. Lifting 5 =  I cannot lift or carry anything at all.  4. Walking 2 =  Pain prevents me from walking more than  mile.  5. Sitting 2 =  Pain prevents me from sitting more than 1 hour.  6. Standing 4 =  Pain prevents me from standing more than 10 minutes.  7. Sleeping 4 =  Even when I take pain medication, I sleep less than 2 hour  8. Social Life 5 =  I have hardly any social life because of my pain.  9. Traveling 4 = My pain restricts my travel to short necessary journeys under 1/2 hour.  10. Employment/ Homemaking  4 = Pain prevents me from doing even light duties.  Total 39/50= 78%   Interpretation of scores: Score Category Description  0-20% Minimal Disability The patient can cope with most living activities. Usually no treatment is indicated apart from advice on lifting, sitting and  exercise  21-40% Moderate Disability The patient experiences more pain and difficulty with sitting, lifting and standing. Travel and social life are more difficult and they may be disabled from work. Personal care, sexual activity and sleeping are not grossly affected, and the patient can usually be managed by conservative means  41-60% Severe Disability Pain remains the main problem in this group, but activities of daily living are affected. These patients require a detailed investigation  61-80% Crippled Back pain impinges on all aspects of the patient's life. Positive intervention is required  81-100% Bed-bound  These patients are either bed-bound or exaggerating their symptoms  Bluford FORBES Zoe DELENA Karon DELENA, et al. Surgery versus conservative management of stable thoracolumbar fracture: the PRESTO feasibility RCT. [place] ([place]): [employer]; 2021 Nov. [facility] Technology Assessment, No. 25.62.) Appendix 3, Oswestry Disability Index category descriptors. Available from: FindJewelers.cz  Minimally Clinically Important Difference (MCID) = 12.8%  COGNITION: Overall cognitive status: Within functional limits for tasks assessed     SENSATION: WFL    LUMBAR ROM:   AROM eval  Flexion 25% with pain  Extension 75%  Right lateral flexion 75%  Left lateral flexion 75%  Right rotation 90%  Left rotation 50% with pain   (Blank rows = not tested)   LOWER EXTREMITY MMT:    MMT Right eval Left eval Right 8/21  Left 8/21  Hip flexion 5/5 5/5    Hip extension      Hip abduction   4-/5 4-/5  Hip adduction      Hip internal rotation      Hip external rotation      Knee flexion      Knee extension 4-/5 with pain 4-/5 with pain, pain down L LE    Ankle dorsiflexion 5/5 Tol min resistance    Ankle plantarflexion WFL seated weak    Ankle inversion      Ankle eversion       (Blank rows = not tested)  LUMBAR SPECIAL TESTS:  Slump test:   positive   -Pt performed Prone lying x 1 min, Prop up < 1 min.  Pt had increased lumbar pain and felt nauseated.  PT had pt sit up and rest.  Pt sat in chair and felt better after resting.   GAIT: Assistive device utilized: None Level of assistance: Complete Independence Comments: WFL, no limp  TREATMENT:                                                                                                                               02/13/24 Palof press BTB 2 x 10  Band lift/chops BTB 2 x 10 each SL hip hinge  2 x 10 Self STM with tennis ball   02/06/24  LTR 10 x 5 second holds Supine ab set 10 x 5 second holds Supine active hamstring stretch 5 x 10 second Bridge 2 x 10 Sidelying hip abduction  Cross legged QL Stretch 3x20seconds ea Bird dog Prone hip extension with knee flexed 2 x 10 Manual: STM to lumbar paraspinals Prone press up x10  01/30/24 LTR 10 x 5 second holds Supine ab set 10 x 5 second holds Supine active hamstring stretch 5 x 10 second Bridge 2 x 10 March with ab set 2 x 10 SLR 1 x 10 Prone hip extension 2 x 10 Manual: STM to lumbar paraspinals, grade II R UPA Prone press up 2 x 10   PATIENT EDUCATION:  Education details: relevant anatomy, rationale of interventions, POC, dx, objective findings, and what to expect next treatment.  PT answered pt's questions.  Person educated: Patient Education method: Explanation Education comprehension: verbalized understanding and needs further education  HOME EXERCISE PROGRAM: Access Code: [email or web address] URL: [email or web address] Date: 01/30/2024 Prepared by: Prentice Adriyanna Christians  Exercises - Supine Lower Trunk Rotation  - 1 x daily - 7 x weekly - 2 sets - 10 reps - Supine Bridge  - 1 x daily - 7 x weekly - 2 sets - 10 reps - Abdominal Bracing  - 1 x daily - 7 x weekly - 2 sets - 10 reps - 5-10 second hold - Supine March  - 1 x daily - 7 x weekly - 2 sets - 10 reps - Supine Active Straight Leg Raise  - 1 x daily -  7 x weekly - 2 sets - 10 reps  ASSESSMENT:  CLINICAL IMPRESSION: Patient with improving symptoms overall. Continued with core and glute strengthening today which is tolerated well. Good mechanics following initial cueing and demonstration.  Patient will continue to benefit from physical therapy in order to improve function and reduce impairment.     EVAL: Patient is a 28 y.o. male with a dx of Right-sided low back pain without sciatica.  Pt has a hx of chronic pain with an exacerbation of pain last year when he fell at work.  Pt fell at speed skating practice a couple of Fridays ago.  Pt has been to the ER recently and the [place] due to pain.  Pt has high levels of pain and reports he can become nauseated and vomit sometimes due to the pain.  He c/o's of central lower lumbar pain which travels down bilat LE's.  Pt has weakness in L LE and R knee extension.  Pt demonstrates limited ROM in lumbar.  Pt states he feels good with sitting.  He has pain and limitations with bending due to pain and feels like his back may give out.  He is unable to bend over to pick up an object.  He enjoys and competes in speed skating though is unable to currently.  He also has pain with work activities.  Pt should benefit from skilled PT to address impairments and to improve overall function..   OBJECTIVE IMPAIRMENTS: decreased activity tolerance, decreased mobility, decreased ROM, decreased strength, hypomobility, and pain.   ACTIVITY LIMITATIONS: carrying, lifting, bending, and dressing  PARTICIPATION LIMITATIONS: cleaning, community activity, and yard work  PERSONAL FACTORS: Time since onset of injury/illness/exacerbation are also affecting patient's functional outcome.   REHAB POTENTIAL: Good  CLINICAL DECISION MAKING: Stable/uncomplicated  EVALUATION COMPLEXITY: Low   GOALS:    SHORT TERM GOALS: Target date:  02/11/24  Pt will be independent and compliant with HEP for improved pain, tolerance to  activity, strength, ROM, and function. Baseline: Goal status: INITIAL  2.  Pt will report his worst pain being no > than 9/10. Baseline:  Goal status: INITIAL  3.  Pt will report at least a 25% improvement in pain and sx's overall.   Baseline:  Goal status: INITIAL  4.  Pt will demonstrate lumbar extension, Sb'ing and rotation to be [facility] bilat for improved daily mobility and stiffness. Baseline:  Goal status: INITIAL Target date:  02/18/2024   5.  Pt will score at least a 65% on the modified Oswestry for clinically significant improvement in self perceived disability and function.  Baseline:  Goal status: INITIAL    LONG TERM GOALS: Target date: 03/03/24  Pt will demo improved LE strength to 5/5 in bilat knee extension and L ankle DF and to [facility] for PF seated for improved performance of and tolerance with functional mobility and to assist with returning to recreational/sporting activities.   Baseline:  Goal status: INITIAL  2.  Pt will demo proper body mechanics with squat to lift/hip hinge for improved performance of functional movements and functional lifting with decreased stress and strain on lumbar.  Baseline:  Goal status: INITIAL  3.  Pt will report at least a 70% improvement in pain and sx's overall.  Baseline:  Goal status: INITIAL  4.  Pt will be able to perform his work activities without significant pain and difficulty. Baseline:  Goal status: INITIAL  5.  Pt will be able to dress without increased pain. Baseline:  Goal status: INITIAL    PLAN:  PT FREQUENCY: 2x/week  PT DURATION: 6 weeks  PLANNED INTERVENTIONS: [id number]- PT Re-evaluation, 97750- Physical Performance Testing, 97110-Therapeutic exercises, 97530- Therapeutic activity, [id number]- Neuromuscular re-education, 97535- Self Care, [id number]- Manual therapy, [phone]- Gait training, [phone]- Aquatic Therapy, [phone]- Electrical stimulation (unattended), [phone]- Electrical stimulation (manual), [id number]- Ultrasound,  Patient/Family education, Balance training, Stair training, Taping, Joint mobilization, Spinal mobilization, Cryotherapy, and Moist heat.  PLAN FOR NEXT SESSION: Continue Core/hip  strengthening.  Assess soft tissue tightness and trigger points.   Prentice RAMAN Breshae Belcher, PT 02/13/2024, 3:22 PM

## 2024-02-18 ENCOUNTER — Encounter (HOSPITAL_BASED_OUTPATIENT_CLINIC_OR_DEPARTMENT_OTHER): Admitting: Physical Therapy

## 2024-02-20 ENCOUNTER — Encounter (HOSPITAL_BASED_OUTPATIENT_CLINIC_OR_DEPARTMENT_OTHER): Admitting: Physical Therapy

## 2024-02-25 ENCOUNTER — Encounter (HOSPITAL_BASED_OUTPATIENT_CLINIC_OR_DEPARTMENT_OTHER): Admitting: Physical Therapy

## 2024-02-27 ENCOUNTER — Encounter (HOSPITAL_BASED_OUTPATIENT_CLINIC_OR_DEPARTMENT_OTHER): Admitting: Physical Therapy

## 2024-03-03 ENCOUNTER — Encounter (HOSPITAL_BASED_OUTPATIENT_CLINIC_OR_DEPARTMENT_OTHER): Admitting: Physical Therapy

## 2024-03-05 ENCOUNTER — Encounter (HOSPITAL_BASED_OUTPATIENT_CLINIC_OR_DEPARTMENT_OTHER): Admitting: Physical Therapy

## 2024-03-10 ENCOUNTER — Encounter (HOSPITAL_BASED_OUTPATIENT_CLINIC_OR_DEPARTMENT_OTHER): Admitting: Physical Therapy

## 2024-03-12 ENCOUNTER — Encounter (HOSPITAL_BASED_OUTPATIENT_CLINIC_OR_DEPARTMENT_OTHER): Admitting: Physical Therapy

## 2024-03-12 ENCOUNTER — Ambulatory Visit (INDEPENDENT_AMBULATORY_CARE_PROVIDER_SITE_OTHER): Admitting: Family Medicine

## 2024-03-12 ENCOUNTER — Encounter (HOSPITAL_BASED_OUTPATIENT_CLINIC_OR_DEPARTMENT_OTHER): Payer: Self-pay | Admitting: Family Medicine

## 2024-03-12 VITALS — BP 112/79 | HR 55 | Ht 69.0 in | Wt 151.0 lb

## 2024-03-12 DIAGNOSIS — R824 Acetonuria: Secondary | ICD-10-CM | POA: Insufficient documentation

## 2024-03-12 DIAGNOSIS — Z0000 Encounter for general adult medical examination without abnormal findings: Secondary | ICD-10-CM | POA: Diagnosis not present

## 2024-03-12 DIAGNOSIS — M545 Low back pain, unspecified: Secondary | ICD-10-CM

## 2024-03-12 NOTE — Assessment & Plan Note (Signed)
 Observed on a couple of occasions during emergency department visit.  Patient generally asymptomatic related to ketonuria findings.  Did not have other lab abnormalities to suggest specific underlying cause of ketonuria. We can proceed with further evaluation today, we will proceed with repeat urine testing as well as additional labs to assess for electrolytes, anion gap, serum beta hydroxybutyrate Further recommendations pending results of labs

## 2024-03-12 NOTE — Assessment & Plan Note (Signed)
 Patient continues to have low back pain, he has been working with physical therapy, however feels that back pain has persisted.  We discussed considerations and he would like to proceed with referral to spine specialist, referral placed today

## 2024-03-12 NOTE — Patient Instructions (Signed)
  Medication Instructions:  Your physician recommends that you continue on your current medications as directed. Please refer to the Current Medication list given to you today. --If you need a refill on any your medications before your next appointment, please call your pharmacy first. If no refills are authorized on file call the office.-- Lab Work: Your physician has recommended that you have lab work today: today If you have labs (blood work) drawn today and your tests are completely normal, you will receive your results via MyChart message OR a phone call from our staff.  Please ensure you check your voicemail in the event that you authorized detailed messages to be left on a delegated number. If you have any lab test that is abnormal or we need to change your treatment, we will call you to review the results.  Referrals/Procedures/Imaging: Referral placed  Follow-Up: Your next appointment:   Your physician recommends that you schedule a follow-up appointment in: 6 month follow up  with Dr. de Peru  You will receive a text message or e-mail with a link to a survey about your care and experience with us  today! We would greatly appreciate your feedback!   Thanks for letting us  be apart of your health journey!!  Primary Care and Sports Medicine   Dr. Quintin sheerer Peru   We encourage you to activate your patient portal called MyChart.  Sign up information is provided on this After Visit Summary.  MyChart is used to connect with patients for Virtual Visits (Telemedicine).  Patients are able to view lab/test results, encounter notes, upcoming appointments, etc.  Non-urgent messages can be sent to your provider as well. To learn more about what you can do with MyChart, please visit --  [email or web address].

## 2024-03-12 NOTE — Progress Notes (Signed)
 Subjective:    CC: Annual Physical Exam  HPI: Kevin [name] is a 28 y.o. presenting for annual physical  I reviewed the past medical history, family history, social history, surgical history, and allergies today and no changes were needed.  Please see the problem list section below in epic for further details.  Past Medical History: Past Medical History:  Diagnosis Date   Bipolar depression (HCC)    Crohn's colitis (HCC)    Thrombosed hemorrhoids    Past Surgical History: Past Surgical History:  Procedure Laterality Date   APPENDECTOMY     Social History: Social History   Socioeconomic History   Marital status: Single    Spouse name: Not on file   Number of children: Not on file   Years of education: Not on file   Highest education level: Not on file  Occupational History   Not on file  Tobacco Use   Smoking status: Never    Passive exposure: Never   Smokeless tobacco: Never  Vaping Use   Vaping status: Never Used  Substance and Sexual Activity   Alcohol use: Not Currently   Drug use: Never   Sexual activity: Not on file  Other Topics Concern   Not on file  Social History Narrative   Not on file   Social Drivers of Health   Financial Resource Strain: Not on file  Food Insecurity: Not on file  Transportation Needs: Not on file  Physical Activity: Not on file  Stress: Not on file  Social Connections: Not on file   Family History: Family History  Problem Relation Age of Onset   Colon cancer Other        grandfather   Pancreatic cancer Other        aunt   Allergies: Allergies  Allergen Reactions   Aripiprazole  Other (See Comments)   Medications: See med rec.  Review of Systems: No headache, visual changes, nausea, vomiting, diarrhea, constipation, dizziness, abdominal pain, skin rash, fevers, chills, night sweats, swollen lymph nodes, weight loss, chest pain, body aches, joint swelling, muscle aches, shortness of breath, mood changes, visual or  auditory hallucinations.  Objective:    BP 112/79 (BP Location: Left Arm, Patient Position: Sitting, Cuff Size: Normal)   Pulse (!) 55   Ht 5' 9 (1.753 m)   Wt 151 lb (68.5 kg)   SpO2 100%   BMI 22.30 kg/m   General: Well Developed, well nourished, and in no acute distress.  Neuro: Alert and oriented x3, extra-ocular muscles intact, sensation grossly intact. Cranial nerves II through XII are intact, motor, sensory, and coordinative functions are all intact. HEENT: Normocephalic, atraumatic, pupils equal round reactive to light, neck supple, no masses, no lymphadenopathy, thyroid nonpalpable. Oropharynx, nasopharynx, external ear canals are unremarkable. Skin: Warm and dry, no rashes noted.  Cardiac: Regular rate and rhythm, no murmurs rubs or gallops.  Respiratory: Clear to auscultation bilaterally. Not using accessory muscles, speaking in full sentences.  Abdominal: Soft, nontender, nondistended, positive bowel sounds, no masses, no organomegaly.  Musculoskeletal: Shoulder, elbow, wrist, hip, knee, ankle stable, and with full range of motion.  Impression and Recommendations:    Wellness examination Assessment & Plan: Routine HCM labs ordered. HCM reviewed/discussed. Anticipatory guidance regarding healthy weight, lifestyle and choices given. Recommend healthy diet.  Recommend approximately 150 minutes/week of moderate intensity exercise Recommend regular dental and vision exams Always use seatbelt/lap and shoulder restraints Recommend using smoke alarms and checking batteries at least twice a year Recommend using sunscreen when  outside Discussed immunization recommendations  Orders: -     CBC with Differential/Platelet -     Comprehensive metabolic panel with GFR -     Lipid panel -     TSH Rfx on Abnormal to Free T4 -     Hemoglobin A1c  Ketonuria Assessment & Plan: Observed on a couple of occasions during emergency department visit.  Patient generally asymptomatic  related to ketonuria findings.  Did not have other lab abnormalities to suggest specific underlying cause of ketonuria. We can proceed with further evaluation today, we will proceed with repeat urine testing as well as additional labs to assess for electrolytes, anion gap, serum beta hydroxybutyrate Further recommendations pending results of labs  Orders: -     Beta-hydroxybutyric acid -     Urinalysis, Complete  Right-sided low back pain without sciatica, unspecified chronicity Assessment & Plan: Patient continues to have low back pain, he has been working with physical therapy, however feels that back pain has persisted.  We discussed considerations and he would like to proceed with referral to spine specialist, referral placed today  Orders: -     Ambulatory referral to Orthopedic Surgery  Return in about 6 months (around 09/09/2024).    ___________________________________________ Debra Calabretta de [place], MD, ABFM, CAQSM Primary Care and Sports Medicine [facility]

## 2024-03-12 NOTE — Assessment & Plan Note (Signed)

## 2024-03-16 ENCOUNTER — Encounter (INDEPENDENT_AMBULATORY_CARE_PROVIDER_SITE_OTHER): Payer: Self-pay

## 2024-03-18 ENCOUNTER — Ambulatory Visit (HOSPITAL_BASED_OUTPATIENT_CLINIC_OR_DEPARTMENT_OTHER): Attending: Family Medicine | Admitting: Physical Therapy

## 2024-03-18 ENCOUNTER — Telehealth (HOSPITAL_BASED_OUTPATIENT_CLINIC_OR_DEPARTMENT_OTHER): Payer: Self-pay | Admitting: Physical Therapy

## 2024-03-18 ENCOUNTER — Ambulatory Visit: Admitting: Orthopaedic Surgery

## 2024-03-18 DIAGNOSIS — M79605 Pain in left leg: Secondary | ICD-10-CM | POA: Insufficient documentation

## 2024-03-18 DIAGNOSIS — G8929 Other chronic pain: Secondary | ICD-10-CM | POA: Insufficient documentation

## 2024-03-18 DIAGNOSIS — M79604 Pain in right leg: Secondary | ICD-10-CM | POA: Insufficient documentation

## 2024-03-18 DIAGNOSIS — M6281 Muscle weakness (generalized): Secondary | ICD-10-CM | POA: Insufficient documentation

## 2024-03-18 DIAGNOSIS — M545 Low back pain, unspecified: Secondary | ICD-10-CM | POA: Insufficient documentation

## 2024-03-18 NOTE — Telephone Encounter (Signed)
 Patient no show, phoned patient regarding missed appointment but was unable to leave message for patient.   2:07 PM, 03/18/24 Prentice CANDIE Stains PT, DPT Physical Therapist at [facility]

## 2024-03-19 LAB — COMPREHENSIVE METABOLIC PANEL WITH GFR
ALT: 21 IU/L (ref 0–44)
AST: 32 IU/L (ref 0–40)
Albumin: 5 g/dL (ref 4.3–5.2)
Alkaline Phosphatase: 49 IU/L (ref 47–123)
BUN/Creatinine Ratio: 24 — ABNORMAL HIGH (ref 9–20)
BUN: 22 mg/dL — ABNORMAL HIGH (ref 6–20)
Bilirubin Total: 0.6 mg/dL (ref 0.0–1.2)
CO2: 24 mmol/L (ref 20–29)
Calcium: 9.8 mg/dL (ref 8.7–10.2)
Chloride: 100 mmol/L (ref 96–106)
Creatinine, Ser: 0.9 mg/dL (ref 0.76–1.27)
Globulin, Total: 2.2 g/dL (ref 1.5–4.5)
Glucose: 75 mg/dL (ref 70–99)
Potassium: 4.7 mmol/L (ref 3.5–5.2)
Sodium: 137 mmol/L (ref 134–144)
Total Protein: 7.2 g/dL (ref 6.0–8.5)
eGFR: 119 mL/min/1.73 (ref >59)

## 2024-03-19 LAB — CBC WITH DIFFERENTIAL/PLATELET
Basophils Absolute: 0 x10E3/uL (ref 0.0–0.2)
Basos: 1 %
EOS (ABSOLUTE): 0.1 x10E3/uL (ref 0.0–0.4)
Eos: 3 %
Hematocrit: 43.3 % (ref 37.5–51.0)
Hemoglobin: 14 g/dL (ref 13.0–17.7)
Immature Grans (Abs): 0 x10E3/uL (ref 0.0–0.1)
Immature Granulocytes: 0 %
Lymphocytes Absolute: 1.2 x10E3/uL (ref 0.7–3.1)
Lymphs: 35 %
MCH: 28.8 pg (ref 26.6–33.0)
MCHC: 32.3 g/dL (ref 31.5–35.7)
MCV: 89 fL (ref 79–97)
Monocytes Absolute: 0.3 x10E3/uL (ref 0.1–0.9)
Monocytes: 8 %
Neutrophils Absolute: 1.9 x10E3/uL (ref 1.4–7.0)
Neutrophils: 53 %
Platelets: 248 x10E3/uL (ref 150–450)
RBC: 4.86 x10E6/uL (ref 4.14–5.80)
RDW: 12.4 % (ref 11.6–15.4)
WBC: 3.5 x10E3/uL (ref 3.4–10.8)

## 2024-03-19 LAB — TSH RFX ON ABNORMAL TO FREE T4: TSH: 2 u[IU]/mL (ref 0.450–4.500)

## 2024-03-19 LAB — URINALYSIS, COMPLETE
Bilirubin, UA: NEGATIVE
Glucose, UA: NEGATIVE
Ketones, UA: NEGATIVE
Leukocytes,UA: NEGATIVE
Nitrite, UA: NEGATIVE
Protein,UA: NEGATIVE
RBC, UA: NEGATIVE
Specific Gravity, UA: 1.027 (ref 1.005–1.030)
Urobilinogen, Ur: 1 mg/dL (ref 0.2–1.0)
pH, UA: 6.5 (ref 5.0–7.5)

## 2024-03-19 LAB — LIPID PANEL
Chol/HDL Ratio: 2.3 ratio (ref 0.0–5.0)
Cholesterol, Total: 142 mg/dL (ref 100–199)
HDL: 61 mg/dL (ref >39)
LDL Chol Calc (NIH): 71 mg/dL (ref 0–99)
Triglycerides: 41 mg/dL (ref 0–149)
VLDL Cholesterol Cal: 10 mg/dL (ref 5–40)

## 2024-03-19 LAB — HEMOGLOBIN A1C
Est. average glucose Bld gHb Est-mCnc: 108 mg/dL
Hgb A1c MFr Bld: 5.4 % (ref 4.8–5.6)

## 2024-03-19 LAB — BETA-HYDROXYBUTYRIC ACID: Beta-Hydroxybutyrate: 0.7 mg/dL

## 2024-03-19 LAB — MICROSCOPIC EXAMINATION
Bacteria, UA: NONE SEEN
Casts: NONE SEEN /LPF
Epithelial Cells (non renal): NONE SEEN /HPF (ref 0–10)
RBC, Urine: NONE SEEN /HPF (ref 0–2)
WBC, UA: NONE SEEN /HPF (ref 0–5)

## 2024-03-20 ENCOUNTER — Other Ambulatory Visit (INDEPENDENT_AMBULATORY_CARE_PROVIDER_SITE_OTHER): Payer: Self-pay

## 2024-03-20 ENCOUNTER — Encounter: Payer: Self-pay | Admitting: Physician Assistant

## 2024-03-20 ENCOUNTER — Ambulatory Visit (INDEPENDENT_AMBULATORY_CARE_PROVIDER_SITE_OTHER): Admitting: Physician Assistant

## 2024-03-20 DIAGNOSIS — G8929 Other chronic pain: Secondary | ICD-10-CM | POA: Diagnosis not present

## 2024-03-20 DIAGNOSIS — M5442 Lumbago with sciatica, left side: Secondary | ICD-10-CM

## 2024-03-20 MED ORDER — METHYLPREDNISOLONE 4 MG PO TBPK: ORAL_TABLET | ORAL | 0 refills | Status: DC
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
## 2024-03-23 ENCOUNTER — Ambulatory Visit (HOSPITAL_BASED_OUTPATIENT_CLINIC_OR_DEPARTMENT_OTHER): Admitting: Family Medicine

## 2024-03-23 ENCOUNTER — Encounter: Payer: Self-pay | Admitting: Physician Assistant

## 2024-03-23 ENCOUNTER — Ambulatory Visit (HOSPITAL_BASED_OUTPATIENT_CLINIC_OR_DEPARTMENT_OTHER): Payer: Self-pay | Admitting: Family Medicine

## 2024-03-23 ENCOUNTER — Encounter (HOSPITAL_BASED_OUTPATIENT_CLINIC_OR_DEPARTMENT_OTHER): Payer: Self-pay

## 2024-03-24 ENCOUNTER — Emergency Department (HOSPITAL_COMMUNITY)
Admission: EM | Admit: 2024-03-24 | Discharge: 2024-03-24 | Disposition: A | Discharge status: Home / Self Care | Attending: Emergency Medicine | Admitting: Emergency Medicine

## 2024-03-24 ENCOUNTER — Other Ambulatory Visit: Payer: Self-pay

## 2024-03-24 DIAGNOSIS — G5782 Other specified mononeuropathies of left lower limb: Secondary | ICD-10-CM

## 2024-03-24 DIAGNOSIS — T148XXA Other injury of unspecified body region, initial encounter: Secondary | ICD-10-CM | POA: Insufficient documentation

## 2024-03-24 DIAGNOSIS — Y990 Civilian activity done for income or pay: Secondary | ICD-10-CM | POA: Diagnosis not present

## 2024-03-24 DIAGNOSIS — X500XXA Overexertion from strenuous movement or load, initial encounter: Secondary | ICD-10-CM | POA: Diagnosis not present

## 2024-03-24 DIAGNOSIS — M549 Dorsalgia, unspecified: Secondary | ICD-10-CM | POA: Diagnosis present

## 2024-03-24 LAB — URINALYSIS, ROUTINE W REFLEX MICROSCOPIC
Bilirubin Urine: NEGATIVE
Glucose, UA: NEGATIVE mg/dL
Hgb urine dipstick: NEGATIVE
Ketones, ur: NEGATIVE mg/dL
Leukocytes,Ua: NEGATIVE
Nitrite: NEGATIVE
Protein, ur: NEGATIVE mg/dL
Specific Gravity, Urine: 1.028 (ref 1.005–1.030)
pH: 5 (ref 5.0–8.0)

## 2024-03-24 MED ORDER — KETOROLAC TROMETHAMINE 30 MG/ML IJ SOLN
15.0000 mg | Freq: Once | INTRAMUSCULAR | Status: DC
  Filled 2024-03-24: qty 1

## 2024-03-24 MED ORDER — PREDNISONE 20 MG PO TABS
60.0000 mg | ORAL_TABLET | ORAL | Status: AC
  Administered 2024-03-24: 60 mg via ORAL
  Filled 2024-03-24: qty 3

## 2024-03-24 MED ORDER — TIZANIDINE HCL 4 MG PO TABS
4.0000 mg | ORAL_TABLET | Freq: Once | ORAL | Status: DC
  Filled 2024-03-24: qty 1

## 2024-03-24 NOTE — Discharge Instructions (Addendum)
 [facility] following the crush injury.  Obtain and take all medication as prescribed.  The steroid prescription was previously sent to the [facility] in [place]

## 2024-03-24 NOTE — ED Triage Notes (Signed)
 Pt BIB [facility] c/o lower back pain. This pain has been ongoing since Sept 27th when he had a refrigerator fall onto his back. Was evaluated from that injury - had a pending MRI in November. Reports pain has worsened tonight. Pain radiating to bilateral hips, into the groin area, and down to left lower knee. Also reports difficulty with initiating urine stream.   EMS VS BP 128/84, PR 60, CBG 92

## 2024-03-24 NOTE — ED Provider Notes (Signed)
 [place] EMERGENCY DEPARTMENT AT [facility] Provider Note   CSN: [id number] Arrival date & time: 03/24/24  9771     Patient presents with: Back Pain   Kevin [name] is a 28 y.o. male.   HPI Patient presents with pain in his back.  He notes a history of injuries while being in the [employer].  He was in his usual state of health until about 10 days ago.  While moving a refrigerator at work, he had a crush injury with the weight of the refrigerator leaning against his body.  He was seen and evaluated at Ortho clinic, has not yet obtained the prescribed medication, presents today with pain in his back diffuse, pain in his lower extremities, but no inability to walk, no syncope, no fever, no chills, no incontinence. Patient states the pain was severe today, and after he attempted to drive, but had to stop due to dizziness he called EMS for transport.     Prior to Admission medications   Medication Sig Start Date End Date Taking? Authorizing Provider  lamoTRIgine  (LAMICTAL ) 25 MG tablet Take 75 mg by mouth daily. 08/13/22   [provider]  methylPREDNISolone (MEDROL DOSEPAK) 4 MG TBPK tablet Take as directed with food 03/20/24   Persons, Ronal Dragon, [place]    Allergies: Aripiprazole     Review of Systems  Updated Vital Signs BP 110/84 (BP Location: Left Arm)   Pulse (!) 56   Temp 97.7 F (36.5 C) (Oral)   Resp 16   SpO2 100%   Physical Exam Vitals and nursing note reviewed.  Constitutional:      General: He is not in acute distress.    Appearance: He is well-developed.  HENT:     Head: Normocephalic and atraumatic.  Eyes:     Conjunctiva/sclera: Conjunctivae normal.  Cardiovascular:     Rate and Rhythm: Normal rate and regular rhythm.  Pulmonary:     Effort: Pulmonary effort is normal. No respiratory distress.     Breath sounds: No stridor.  Abdominal:     General: There is no distension.  Skin:    General: Skin is warm and dry.  Neurological:      General: No focal deficit present.     Mental Status: He is alert and oriented to person, place, and time.     Comments: Face is symmetric speech is clear patient moves all extremity spontaneously, flexes each hip and both knees against resistance with symmetric appropriate strength.     (all labs ordered are listed, but only abnormal results are displayed) Labs Reviewed  URINALYSIS, ROUTINE W REFLEX MICROSCOPIC    EKG: None  Radiology: No results found.   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  He and I had a lengthy conversation about the event, 10 days ago, need for ongoing evaluation, follow-up.  Low suspicion for consideration of internal injury, renal dysfunction, urinalysis, labs ordered.  Patient declined lab studies.  I reviewed the x-ray from his last Ortho visit, essentially unremarkable aside from straightening of the lumbar spine.  Given passage of 10  days, low suspicion for internal bleeding that is substantial.  Patient declined Toradol , Zanaflex, was amenable to prednisone, states he will follow-up at the [facility].  Given past 10 days, absence of hemodynamic stability, absence of decompensated state, patient appropriate for close outpatient follow-up at the [facility].  Amount and/or Complexity of Data Reviewed External Data Reviewed: notes. Labs: ordered. Decision-making details documented in ED Course.    Details: Urinalysis unremarkable Radiology: independent interpretation performed. Decision-making details documented in ED Course.  Risk Prescription drug management.   9:55 AM Patient in no distress, awake, alert, we discussed importance of following up with the [facility].  Final diagnoses:  Crush injury     ED Discharge Orders     None          Garrick Charleston, MD 03/24/24 [phone]

## 2024-03-24 NOTE — ED Notes (Signed)
 Pt provided with urine cup - sts he's not able to provide urine at this time

## 2024-03-24 NOTE — ED Notes (Addendum)
 Spoke with pt due to his request. Pt is aware he needs to provide a urine sample, also voiced he is unable to provide. Provided compassion as he told the background story of an injury a few weeks ago involving back pain, hip and groin. He is willing to stay, warm blanket provided.

## 2024-03-24 NOTE — ED Notes (Signed)
 Patient unable to provide urine specimen at this time.

## 2024-03-25 ENCOUNTER — Encounter (HOSPITAL_BASED_OUTPATIENT_CLINIC_OR_DEPARTMENT_OTHER): Admitting: Physical Therapy

## 2024-03-27 ENCOUNTER — Other Ambulatory Visit: Payer: Self-pay | Admitting: Genetic Counselor

## 2024-03-31 ENCOUNTER — Ambulatory Visit: Admitting: Physician Assistant

## 2024-03-31 ENCOUNTER — Ambulatory Visit (HOSPITAL_BASED_OUTPATIENT_CLINIC_OR_DEPARTMENT_OTHER): Admitting: Family Medicine

## 2024-04-01 ENCOUNTER — Telehealth: Payer: Self-pay | Admitting: Orthopedic Surgery

## 2024-04-01 NOTE — Telephone Encounter (Signed)
 Christy from [facility] called stating pt insurance is asking for a pier to [employer] for pt MRI. Pier to [employer] number is [phone] tracking number [phone]. [facility] number is [phone] ext [phone].

## 2024-04-03 ENCOUNTER — Ambulatory Visit (HOSPITAL_BASED_OUTPATIENT_CLINIC_OR_DEPARTMENT_OTHER): Admitting: Orthopaedic Surgery

## 2024-04-06 NOTE — Telephone Encounter (Signed)
 I did a peer to peer on Friday. Patient is coming in this week. Need to document a couple more items then Dr Lenda (peer to peer doc) said it should be approved

## 2024-04-07 ENCOUNTER — Ambulatory Visit: Admitting: Physician Assistant

## 2024-04-14 ENCOUNTER — Ambulatory Visit (INDEPENDENT_AMBULATORY_CARE_PROVIDER_SITE_OTHER): Admitting: Physician Assistant

## 2024-04-14 ENCOUNTER — Encounter: Payer: Self-pay | Admitting: Physician Assistant

## 2024-04-14 DIAGNOSIS — M5441 Lumbago with sciatica, right side: Secondary | ICD-10-CM

## 2024-04-14 DIAGNOSIS — G8929 Other chronic pain: Secondary | ICD-10-CM

## 2024-04-14 DIAGNOSIS — M5442 Lumbago with sciatica, left side: Secondary | ICD-10-CM

## 2024-04-14 MED ORDER — METHYLPREDNISOLONE 4 MG PO TBPK: ORAL_TABLET | ORAL | 0 refills | Status: DC

## 2024-04-14 NOTE — Progress Notes (Signed)
 Office Visit Note   Patient: Kevin [name]           Date of Birth: [birth year]           MRN: [id number] Visit Date: 04/14/2024              Requested by: de Cuba, Raymond J, MD [address] [place],  [place] [address] PCP: de Cuba, Raymond J, MD   Assessment & Plan: Visit Diagnoses:  1. Chronic bilateral low back pain with sciatica, sciatica laterality unspecified     Plan: Patient is a pleasant 28 year old gentleman who is status post injury while at work on September 27.  He works at [employer] and was moving a refrigerator when the refrigerator on the trolley which pushed down against him.  He is having significant radicular findings and weakness.  This is only gotten worse since his initial visit with me earlier this month.  It was bad enough that he returned to the emergency room where CT scan was done.  I had already ordered an MRI scan.  I am going to order this stat as I have seen a significant increase in his weakness especially on the left side.  With flexion and extension on his right leg he has 5/5 strength with resisted extension 8 out of 10 with resisted flexion on the left side he has strength of 2 out of 5 with both flexion and extension of his legs.  With regards to dorsiflexion and plantarflexion of his ankles on the left he is 1 out of 5 with plantarflexion and dorsiflexion almost developing a early foot drop.  On the right side he is  5/5 with flexion both plantar and dorsiflexion and strength.  He has decreased sensation running down his left leg.  He has a significant antalgic gait.  He does have episodes of his leg giving way.  I think he needs a stat MRI he already has an appointment with Dr. Georgina for next week.  This is a significant decline in his exam since previous exam earlier this month.  He tells me that this is causing him mental health issues.  He does have a history of this in the past.  I did question him about any suicidal thoughts and he said although he has  thought about it this is just been passing and he has no plan or intention.  He has been told to contact myself or any other medical health professional if he has concerns.  He should go to the nearest emergency room if he has any progressive problems.  Follow-Up Instructions: From November 3 with Dr. Georgina  Orders:  No orders of the defined types were placed in this encounter.  Meds ordered this encounter  Medications   methylPREDNISolone (MEDROL DOSEPAK) 4 MG TBPK tablet    Sig: Take as directed with food    Dispense:  21 tablet    Refill:  0      Procedures: No procedures performed   Clinical Data: No additional findings.   Subjective: Chief Complaint  Patient presents with   Lower Back - Pain    HPI pleasant 28 year old gentleman returns today for ongoing and progressive back pain and weakness.  I last saw him October 7.  I believe at that time an MRI was medically necessary.  At this time he says his progression of his symptoms has increased.  He did go to the ER where a site CT scan was ordered.  Review of Systems  Objective: Vital Signs: There were no vitals taken for this visit.  Physical Exam Constitutional:      Appearance: Normal appearance.  Pulmonary:     Effort: Pulmonary effort is normal.  Musculoskeletal:        General: Normal range of motion.  Skin:    General: Skin is warm and dry.  Neurological:     General: No focal deficit present.     Mental Status: He is alert and oriented to person, place, and time.     Ortho Exam Significant antalgic gait he has very little flexion or extension in his back secondary to pain.  He has a significantly positive straight leg raise on the left moderate straight leg raise on the right he has decreased strength to a 2/5 both with flexion and extension of his knees.  He is actually 1/5 with flexion dorsiflexion and plantarflexion of his left ankle.  Right knee has some deficit with flexion of his knee at 4/5  strong with dorsiflexion and plantarflexion.  He has a he has decreased sensation on the left lateral side of his leg  Specialty Comments:  No specialty comments available.  Imaging: No results found.   PMFS History: Patient Active Problem List   Diagnosis Date Noted   Wellness examination 03/12/2024   Ketonuria 03/12/2024   Bipolar affective disorder, currently depressed, mild (HCC) 11/25/2023   Gastroesophageal reflux disease 11/25/2023   Hypotension 11/25/2023   Other specified counseling 11/25/2023   Low back pain 11/25/2023   Cerumen impaction 11/25/2023   Solitary pulmonary nodule 11/15/2022   Pain in left wrist 01/16/2022   Insomnia 11/14/2021   Severe bipolar I disorder, most recent episode depressed (HCC) 11/14/2021   Closed fracture of left distal radius 06/06/2021   Unstable angina (HCC) 02/04/2021   Chest pain 02/04/2021   Alcohol use, unspecified with unspecified alcohol-induced disorder 08/06/2017   Bipolar II disorder (HCC) 07/31/2017   Generalized enlarged lymph nodes 11/14/2016   Hypermetropia, bilateral 09/21/2016   Past Medical History:  Diagnosis Date   Bipolar depression (HCC)    Crohn's colitis (HCC)    Thrombosed hemorrhoids     Family History  Problem Relation Age of Onset   Colon cancer Other        grandfather   Pancreatic cancer Other        aunt    Past Surgical History:  Procedure Laterality Date   APPENDECTOMY     Social History   Occupational History   Not on file  Tobacco Use   Smoking status: Never    Passive exposure: Never   Smokeless tobacco: Never  Vaping Use   Vaping status: Never Used  Substance and Sexual Activity   Alcohol use: Not Currently   Drug use: Never   Sexual activity: Not on file

## 2024-04-15 ENCOUNTER — Telehealth: Payer: Self-pay | Admitting: Family

## 2024-04-15 NOTE — Telephone Encounter (Signed)
 Pt called stating that his rt ankle on the inside is swelling and he thinks it related to the back thing. Says that he needs MRI.  Pt call back number is [phone].

## 2024-04-16 ENCOUNTER — Encounter: Payer: Self-pay | Admitting: Physician Assistant

## 2024-04-16 ENCOUNTER — Other Ambulatory Visit: Payer: Self-pay

## 2024-04-16 ENCOUNTER — Other Ambulatory Visit (INDEPENDENT_AMBULATORY_CARE_PROVIDER_SITE_OTHER): Payer: Self-pay

## 2024-04-16 ENCOUNTER — Ambulatory Visit (INDEPENDENT_AMBULATORY_CARE_PROVIDER_SITE_OTHER): Admitting: Physician Assistant

## 2024-04-16 DIAGNOSIS — M25572 Pain in left ankle and joints of left foot: Secondary | ICD-10-CM | POA: Diagnosis not present

## 2024-04-16 DIAGNOSIS — M25472 Effusion, left ankle: Secondary | ICD-10-CM | POA: Diagnosis not present
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
## 2024-04-17 ENCOUNTER — Encounter: Payer: Self-pay | Admitting: Radiology

## 2024-04-20 ENCOUNTER — Encounter: Payer: Self-pay | Admitting: Radiology

## 2024-04-20 ENCOUNTER — Ambulatory Visit: Admitting: Orthopedic Surgery

## 2024-04-22 ENCOUNTER — Inpatient Hospital Stay
Admission: RE | Admit: 2024-04-22 | Discharge: 2024-04-22 | Disposition: A | Source: Ambulatory Visit | Attending: Physician Assistant

## 2024-04-22 DIAGNOSIS — G8929 Other chronic pain: Secondary | ICD-10-CM

## 2024-04-23 ENCOUNTER — Encounter: Payer: Self-pay | Admitting: Physician Assistant

## 2024-04-23 ENCOUNTER — Other Ambulatory Visit (INDEPENDENT_AMBULATORY_CARE_PROVIDER_SITE_OTHER): Payer: Self-pay

## 2024-04-23 ENCOUNTER — Ambulatory Visit (INDEPENDENT_AMBULATORY_CARE_PROVIDER_SITE_OTHER): Admitting: Orthopedic Surgery

## 2024-04-23 VITALS — BP 128/79 | HR 83 | Ht 69.0 in | Wt 151.0 lb

## 2024-04-23 DIAGNOSIS — M545 Low back pain, unspecified: Secondary | ICD-10-CM

## 2024-04-23 NOTE — Progress Notes (Signed)
 Orthopedic Spine Surgery Office Note  Assessment: Patient is a 28 y.o. male with low back pain after lifting a refrigerator at work. Has a disc herniation at L5/S1.    Plan: -Explained that initially conservative treatment is tried as a significant number of patients may experience relief with these treatment modalities. Discussed that the conservative treatments include:  -activity modification  -physical therapy  -over the counter pain medications  -medrol dosepak  -lyrica/gabapentin  -muscle relaxers  -lumbar steroid injections -After covering all the conservative treatments, the patient wanted to talk more about surgical management since his pain has not gotten any better with conservative treatments and seems to be getting worse. I talked about a L5/S1 microdiscectomy as an option. I told him that his pain in the left leg is not consistent with his disc herniation since his disc herniation abuts the right S1 nerve root and does not appear to be compressing any neural elements on the left side. It also does not explain his left lower extremity weakness. He is undergoing further work up with Ronal Dragon and has a MRI ordered. I also explained that back pain relief with microdiscectomy is not predictable. After our conversation - further noted below - he wanted to proceed with surgery -Patient will next be seen at date of surgery   The patient has has had increased pain since moving a refrigerator. His MRI shows a disc herniation at L5/S1. I explained that most of the time, the symptoms related to herniated disc get better with conservative treatments. This patient has tried conservative treatment now for 6 weeks without any relief of his symptoms. Accordingly, discussed surgery in the form of microdiscectomy as an option for treatment.  The risks of the surgery including but not limited to recurrent disc herniation, persistent pain, dural tear, nerve root injury, paralysis, infection, bleeding,  fracture, instability, dvt/pe, need for additional procedures, heart attack, and death were discussed with the patient. The benefits of surgery would be potential improvement in his back pain. I explained that back pain relief is not predictable with spine surgery. The alternatives to surgical management were covered with the patient and included activity modification, physical therapy, over-the-counter pain medications, and injections.  All the patient's questions were answered to his satisfaction. After this discussion, the patient expressed understanding and elected to proceed with surgical intervention.    ___________________________________________________________________________   History:  Patient is a 28 y.o. male who presents today for lumbar spine. Patient has had about six weeks of pain that starts in his low back that goes into lumbar paraspinal muscles. He has also had a stabbing pain over the lateral aspect of the his left knee. He has also noticed pain over the lateral aspect of his left leg and into the left ankle. He has no pain radiating into the right lower extremity. This pain started after he was at work helping move a refrigerator. He has tried multiple conservative treatments but has not noticed any improvement in his pain.    Weakness: Yes, has noticed weakness in his left lower extremity. No other weakness noted Symptoms of imbalance: Denies Paresthesias and numbness: Yes, sometimes gets numbness and paresthesias along the lateral left leg Bowel or bladder incontinence: Denies Saddle anesthesia: Denies  Treatments tried: PT, tylenol , ibuprofen, medrol dose pak  Review of systems: Denies fevers and chills, night sweats, unexplained weight loss, history of cancer. Has had pain that wakes him at night  Past medical history: Bipolar disorder Migraines Crohn's colitis  Allergies:  aripiprazole    Past surgical history:  Appendectomy  Social history: Denies use of  nicotine product (smoking, vaping, patches, smokeless) Alcohol use: denies Denies recreational drug use   Physical Exam:  BMI of 22.3  General: no acute distress, appears stated age Neurologic: alert, answering questions appropriately, following commands Respiratory: unlabored breathing on room air, symmetric chest rise Psychiatric: appropriate affect, normal cadence to speech   MSK (spine):  -Strength exam      Left  Right EHL    4/5  3/5 TA    5/5  3/5 GSC    5/5  5/5 Knee extension  5/5  4/5 Hip flexion   4/5  4/5  -Sensory exam    Sensation intact to light touch in L3-S1 nerve distributions of bilateral lower extremities  -Achilles DTR: 2/4 on the left, 2/4 on the right -Patellar tendon DTR: 2/4 on the left, 2/4 on the right  -Straight leg raise: negative bilaterally -Clonus: no beats bilaterally  -Left hip exam: no pain through range of motion -Right hip exam: no pain through range of motion  Imaging: XRs of the lumbar spine from 04/23/2024 were independently reviewed and interpreted, showing disc height loss at L5/S1.  No other significant degenerative changes seen.  No evidence of instability on flexion/tension views.  No fracture or dislocation seen.  MRI of the lumbar spine from 04/22/2024 was independently reviewed and interpreted, showing a central and right paracentral disc herniation at L5/S1.  The disc herniation abuts the right descending S1 nerve.  No neural compression seen on the right side.  DDD at L5/S1.  No other significant degenerative disc disease seen.   Patient name: Kevin [name] Patient MRN: [id number] Date of visit: 04/23/24

## 2024-04-29 ENCOUNTER — Ambulatory Visit
Admission: RE | Admit: 2024-04-29 | Discharge: 2024-04-29 | Disposition: A | Discharge status: Home / Self Care | Source: Ambulatory Visit | Attending: Physician Assistant | Admitting: Physician Assistant

## 2024-04-29 DIAGNOSIS — M25572 Pain in left ankle and joints of left foot: Secondary | ICD-10-CM

## 2024-04-29 MED ORDER — GADOPICLENOL 0.5 MMOL/ML IV SOLN
7.5000 mL | Freq: Once | INTRAVENOUS | Status: AC | PRN
  Administered 2024-04-29: 7.5 mL via INTRAVENOUS

## 2024-05-01 ENCOUNTER — Other Ambulatory Visit: Payer: Self-pay | Admitting: Physician Assistant

## 2024-05-01 DIAGNOSIS — M25572 Pain in left ankle and joints of left foot: Secondary | ICD-10-CM

## 2024-05-04 ENCOUNTER — Ambulatory Visit: Admitting: Orthopaedic Surgery

## 2024-05-07 ENCOUNTER — Encounter: Admitting: Sports Medicine

## 2024-05-12 ENCOUNTER — Encounter: Admitting: Sports Medicine

## 2024-05-22 ENCOUNTER — Encounter (HOSPITAL_COMMUNITY): Payer: Self-pay | Admitting: Orthopedic Surgery

## 2024-05-22 ENCOUNTER — Other Ambulatory Visit: Payer: Self-pay

## 2024-05-22 NOTE — Progress Notes (Signed)
 SDW call  Patient was given pre-op instructions over the phone. Patient verbalized understanding of instructions provided.  States he has the flu from someone at work with fever runny nose, sneezing and body aches.  Initially stated he had a fever today and then back peddled and said it was yesterday.  He is not going to work tomorrow, Saturday due to being sick.  [employer] sent to Dr. Ozell Ada, Anesthesia and SS charge RN for possible re-schedule due to symptoms.  Patient also does not have a ride or someone to stay with him. Became defensive about it and said he could stay by himself. Will need to verify he has someone with him.    PCP - Dr. Quintin Crane Cardiologist -  Pulmonary:    PPM/ICD - denies Device Orders - na Rep Notified - na   Chest x-ray - na EKG -  na Stress Test - ECHO - 02/05/2021 Cardiac Cath -   Sleep Study/sleep apnea/CPAP: denies  Non-diabetic   Blood Thinner Instructions: denies Aspirin  Instructions:denies   ERAS Protcol - Clears until 0430   Anesthesia review: Yes, see above re: flu symptoms.   Your procedure is scheduled on Tuesday May 26, 2024  Report to [facility] Main Entrance A at   0530 A.M., then check in with the Admitting office.  Call this number if you have problems the morning of surgery:  [phone]   If you have any questions prior to your surgery date call [phone]: Open Monday-Friday 8am-4pm If you experience any cold or flu symptoms such as cough, fever, chills, shortness of breath, etc. between now and your scheduled surgery, please notify us  at the above number    Remember:  Do not eat after midnight the night before your surgery  You may drink clear liquids until  0430   the morning of your surgery.   Clear liquids allowed are: Water, Non-Citrus Juices (without pulp), Carbonated Beverages, Clear Tea, Black Coffee ONLY (NO MILK, CREAM OR POWDERED CREAMER of any kind), and Gatorade   Take these medicines the morning  of surgery with A SIP OF WATER:  Lamictal , states hasn't taken in 2 months  As of today, STOP taking any Aspirin  (unless otherwise instructed by your surgeon) Aleve , Naproxen , Ibuprofen, Motrin, Advil, Goody's, BC's, all herbal medications, fish oil, and all vitamins.

## 2024-05-25 ENCOUNTER — Telehealth: Payer: Self-pay

## 2024-05-25 NOTE — Telephone Encounter (Signed)
 Patsy with Pre-admission would like to know if patients surgery needs to be rescheduled.  CB# [phone].  Please advise.  Thank you

## 2024-05-25 NOTE — Telephone Encounter (Signed)
 I called and lmom about the message from Dr. Georgina, I advised him to MyChart message me back.

## 2024-05-25 NOTE — Progress Notes (Signed)
 Called Dr. Jeraline office this morning, was only able to leave a VM for triage making them aware of symptoms patient reported on Friday.

## 2024-05-26 DIAGNOSIS — Z01818 Encounter for other preprocedural examination: Secondary | ICD-10-CM

## 2024-05-29 ENCOUNTER — Ambulatory Visit: Admitting: Sports Medicine

## 2024-05-29 ENCOUNTER — Encounter: Payer: Self-pay | Admitting: Sports Medicine

## 2024-05-29 ENCOUNTER — Other Ambulatory Visit: Payer: Self-pay

## 2024-05-29 DIAGNOSIS — M25572 Pain in left ankle and joints of left foot: Secondary | ICD-10-CM | POA: Diagnosis not present

## 2024-05-29 DIAGNOSIS — M67472 Ganglion, left ankle and foot: Secondary | ICD-10-CM | POA: Diagnosis not present

## 2024-05-29 NOTE — Progress Notes (Signed)
 Kevin [name] - 28 y.o. male MRN [id number]  Date of birth: 11-04-1995  Office Visit Note: Visit Date: 05/29/2024 PCP: de [place], Raymond J, MD Referred by: Persons, Ronal Dragon, PA  Subjective: Chief Complaint  Patient presents with   Left Ankle - Pain   HPI: Kevin [name] is a pleasant 28 y.o. male who presents today for left ankle pain > 1 month with dorsal swelling.  A little over 1 month ago he noticed a swelling of the anterior ankle joint.  This causes some discomfort and is bothersome with certain shoewear given the size and swelling.  Denies any fever or chills, no redness.  He did suffer an injury moreso to the low back when he had a refrigerator fall on him, but this area did not pop up until about 2 weeks later.  He is interested in having this aspirated.  He is not taking any medication for this.  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 Genelle previous.  Pertinent ROS were reviewed with the patient and found to be negative unless otherwise specified above in HPI.   Assessment & Plan: Visit Diagnoses:  1. Ganglion cyst of left foot   2. Pain in left ankle and joints of left foot    Plan: Impression is chronic and persistent left anterior ankle cystic structure which does resemble ganglion cyst.  This does cause discomfort given size and location.  Through shared decision making, we did proceed with ultrasound-guided cyst aspiration which yielded 2 cc of blood-tinged gelatinous material, low-dose CSI injection placed to help alleviate reoccurence.  Patient will keep ankle compression for the next 48 to 72 hours and modified rest/activity.  Did discuss the possibility that this could return.  He may use over-the-counter anti-inflammatories and/or ice as needed in the short-term.  He does have a back procedure with Dr. Georgina upcoming.  He will follow-up with me for the ankle as needed.  MRI  reviewed which shows no structural abnormality of the ankle or the tendons themselves, which is reassuring.  Follow-up: Return if symptoms worsen or fail to improve.   Meds & Orders: No orders of the defined types were placed in this encounter.   Orders Placed This Encounter  Procedures   US  Extrem Low Left Ltd     Procedures:  Procedure: US -guided Dorsal Ganglion Cyst Aspiration/ Injection, Left Ankle/Foot After informed verbal consent and discussion on risks/benefits/indications, a timeout was performed. The patient was lying supine on exam table with affected left foot/ankle placed on procedure table. Area overlying the patient's dorsal foot and region of ganglion cyst prepped with Chloraprep and alcohol swabs. This was anesthesized with 3 cc of lidocaine  1% first. Then utilizing ultrasound guidance, the patient's ganglion cyst was identified in a long axis view. Using ultrasound guidance with an in-plane approach the ganglion cyst was aspirated with a 18-gauge, 1.5 needle yielding approximately 2cc of a blood-tinged gelatinous material. Following aspiration, the cyst remnant was subsequently injected with 1.0:0.5cc of bupivicaine:betamethasone using the same portal. Patient tolerated procedure well without immediate complications. Area was covered with a band-aid and then wrapped with compressive wrap.               Clinical History: No specialty comments available.  He reports that he has never smoked. He has never been exposed to tobacco smoke. He has never used smokeless tobacco.  Recent Labs    03/12/24 1408  HGBA1C 5.4    Objective:     Physical Exam  Gen: Well-appearing, in no acute distress; non-toxic CV: Well-perfused. Warm.  Resp: Breathing unlabored on room air; no wheezing. Psych: Fluid speech in conversation; appropriate affect; normal thought process  Ortho Exam - Left ankle: There is a notable superficial swelling just superficial to the ankle mortise.   There is no surrounding cellulitis or warmth to the area.  There is full range of motion of the ankle with flexion and plantarflexion. + DP pulse distally.  Imaging:  *Did review MRI ankle during the visit today.  Narrative & Impression  EXAM DESCRIPTION: MR ANKLE LEFT W WO CONTRAST   CLINICAL HISTORY: Pain.   COMPARISON: None Available.   TECHNIQUE: MRI of the ankle is performed according to our usual protocol with multiplanar multi sequence imaging.   FINDINGS: No fracture. No erosions. No significant degenerative change. The marrow signal is unremarkable. No joint effusion. The tendons and musculature are unremarkable. The ligaments are intact.   There is a mildly septated cystic process seen anterior and medial to the distal tibia. This measures up to 2.3 cm in axial dimension and is most consistent with a ganglion cyst.   IMPRESSION: Mildly septated simple appearing cystic structure anterior and medial to the distal tibia. This measures up to 2.3 cm in axial dimension and is likely a ganglion cyst. Correlate for palpable abnormality.   Electronically signed by: Reyes Frees MD 04/29/2024 04:26 PM EST RP Workstation: [id number]    Past Medical/Family/Surgical/Social History: Medications & Allergies reviewed per EMR, new medications updated. Patient Active Problem List   Diagnosis Date Noted   Left ankle effusion 04/16/2024   Wellness examination 03/12/2024   Ketonuria 03/12/2024   Bipolar affective disorder, currently depressed, mild (HCC) 11/25/2023   Gastroesophageal reflux disease 11/25/2023   Hypotension 11/25/2023   Other specified counseling 11/25/2023   Low back pain 11/25/2023   Cerumen impaction 11/25/2023   Solitary pulmonary nodule 11/15/2022   Pain in left wrist 01/16/2022   Insomnia 11/14/2021   Severe bipolar I disorder, most recent episode depressed (HCC) 11/14/2021   Closed fracture of left distal radius 06/06/2021   Unstable angina (HCC)  02/04/2021   Chest pain 02/04/2021   Alcohol use, unspecified with unspecified alcohol-induced disorder 08/06/2017   Bipolar II disorder (HCC) 07/31/2017   Generalized enlarged lymph nodes 11/14/2016   Hypermetropia, bilateral 09/21/2016   Past Medical History:  Diagnosis Date   Bipolar depression (HCC)    Crohn's colitis (HCC)    Thrombosed hemorrhoids    Family History  Problem Relation Age of Onset   Colon cancer Other        grandfather   Pancreatic cancer Other        aunt   Past Surgical History:  Procedure Laterality Date   APPENDECTOMY     Social History   Occupational History   Not on file  Tobacco Use   Smoking status: Never    Passive exposure: Never   Smokeless tobacco: Never  Vaping Use   Vaping status: Never Used  Substance and Sexual Activity   Alcohol use: Not Currently   Drug use: Never   Sexual activity: Not on file

## 2024-06-03 ENCOUNTER — Other Ambulatory Visit (HOSPITAL_COMMUNITY)

## 2024-06-08 ENCOUNTER — Encounter: Admitting: Orthopedic Surgery

## 2024-06-08 ENCOUNTER — Other Ambulatory Visit: Payer: Self-pay

## 2024-06-08 NOTE — Pre-Procedure Instructions (Signed)
 SDW CALL  Patient did not answer. Voicemail left with instructions below. Patient called back, instructions given via phone as well. Pt reports that he does have a ride home and someone to stay with him and he will provide this information tomorrow. Pt reports that he has not had flu symptoms in 2.5 weeks.    Surgical Instructions    Your procedure is scheduled on December 23  Report to [facility] Main Entrance A at 5:30 A.M., then check in with the Admitting office.  Call this number if you have problems the morning of surgery:  [phone]    Remember:  Do not eat after midnight the night before your surgery  You may drink clear liquids until 4:30AM the morning of your surgery.   Clear liquids allowed are: Water, Non-Citrus Juices (without pulp), Carbonated Beverages, Clear Tea, Black Coffee ONLY (NO MILK, no honey, CREAM OR POWDERED CREAMER of any kind), and Gatorade   Take these medicines the morning of surgery with A SIP OF WATER:  none   As of today, STOP taking any Aspirin  (unless otherwise instructed by your surgeon) Aleve , Naproxen , Ibuprofen, Motrin, Advil, Goody's, BC's, all herbal medications, fish oil, and all vitamins.  [facility] is not responsible for any belongings or valuables.   Do NOT Smoke (Tobacco/Vaping)  24 hours prior to your procedure  Patients discharged the day of surgery will not be allowed to drive home, and someone needs to stay with them for 24 hours.   SURGICAL WAITING ROOM VISITATION You may have 2 visitor in the pre-op area at a time determined by the pre-op nurse. Patients having surgery or a procedure in a hospital may have two support people in the waiting room.    Day of Surgery:  Take a shower the day of or night before with antibacterial soap. Wear Clean/Comfortable clothing the morning of surgery Do not apply any deodorants/lotions.   Do not wear jewelry  Do not wear lotions, powders, perfumes/colognes, or deodorant. Do not  shave 48 hours prior to surgery.  Men may shave face and neck. Do not bring valuables to the hospital.  Remember to brush your teeth WITH YOUR REGULAR TOOTHPASTE.

## 2024-06-09 ENCOUNTER — Encounter: Admission: RE | Disposition: A | Payer: Self-pay | Attending: Orthopedic Surgery

## 2024-06-09 ENCOUNTER — Other Ambulatory Visit (HOSPITAL_COMMUNITY): Payer: Self-pay

## 2024-06-09 ENCOUNTER — Ambulatory Visit (HOSPITAL_COMMUNITY): Admitting: Anesthesiology

## 2024-06-09 ENCOUNTER — Ambulatory Visit (HOSPITAL_COMMUNITY)

## 2024-06-09 ENCOUNTER — Encounter (HOSPITAL_COMMUNITY): Payer: Self-pay | Admitting: Orthopedic Surgery

## 2024-06-09 ENCOUNTER — Ambulatory Visit (HOSPITAL_COMMUNITY)
Admission: RE | Admit: 2024-06-09 | Discharge: 2024-06-09 | Disposition: A | Discharge status: Home / Self Care | Attending: Orthopedic Surgery | Admitting: Orthopedic Surgery

## 2024-06-09 DIAGNOSIS — M5126 Other intervertebral disc displacement, lumbar region: Secondary | ICD-10-CM

## 2024-06-09 DIAGNOSIS — K219 Gastro-esophageal reflux disease without esophagitis: Secondary | ICD-10-CM | POA: Diagnosis not present

## 2024-06-09 DIAGNOSIS — Z01818 Encounter for other preprocedural examination: Secondary | ICD-10-CM

## 2024-06-09 DIAGNOSIS — M5127 Other intervertebral disc displacement, lumbosacral region: Secondary | ICD-10-CM | POA: Diagnosis not present

## 2024-06-09 HISTORY — PX: LUMBAR LAMINECTOMY/DECOMPRESSION MICRODISCECTOMY: SHX5026

## 2024-06-09 LAB — CBC
HCT: 45.6 % (ref 39.0–52.0)
Hemoglobin: 15 g/dL (ref 13.0–17.0)
MCH: 28.9 pg (ref 26.0–34.0)
MCHC: 32.9 g/dL (ref 30.0–36.0)
MCV: 87.9 fL (ref 80.0–100.0)
Platelets: 246 K/uL (ref 150–400)
RBC: 5.19 MIL/uL (ref 4.22–5.81)
RDW: 11.9 % (ref 11.5–15.5)
WBC: 4.9 K/uL (ref 4.0–10.5)
nRBC: 0 % (ref 0.0–0.2)

## 2024-06-09 LAB — BASIC METABOLIC PANEL WITH GFR
Anion gap: 10 (ref 5–15)
BUN: 25 mg/dL — ABNORMAL HIGH (ref 6–20)
CO2: 26 mmol/L (ref 22–32)
Calcium: 9.7 mg/dL (ref 8.9–10.3)
Chloride: 102 mmol/L (ref 98–111)
Creatinine, Ser: 1.02 mg/dL (ref 0.61–1.24)
GFR, Estimated: >60 mL/min
Glucose, Bld: 95 mg/dL (ref 70–99)
Potassium: 4.1 mmol/L (ref 3.5–5.1)
Sodium: 137 mmol/L (ref 135–145)

## 2024-06-09 LAB — SURGICAL PCR SCREEN
MRSA, PCR: NEGATIVE
Staphylococcus aureus: NEGATIVE

## 2024-06-09 SURGERY — LUMBAR LAMINECTOMY/DECOMPRESSION MICRODISCECTOMY 1 LEVEL
Anesthesia: General | Site: Spine Lumbar

## 2024-06-09 MED ORDER — KETAMINE HCL 50 MG/5ML IJ SOSY
PREFILLED_SYRINGE | INTRAMUSCULAR | Status: AC
  Filled 2024-06-09: qty 5

## 2024-06-09 MED ORDER — FENTANYL CITRATE (PF) 250 MCG/5ML IJ SOLN
INTRAMUSCULAR | Status: AC
  Filled 2024-06-09: qty 5

## 2024-06-09 MED ORDER — 0.9 % SODIUM CHLORIDE (POUR BTL) OPTIME
TOPICAL | Status: DC | PRN
  Administered 2024-06-09: 1000 mL

## 2024-06-09 MED ORDER — DEXAMETHASONE SOD PHOSPHATE PF 10 MG/ML IJ SOLN
INTRAMUSCULAR | Status: DC | PRN
  Administered 2024-06-09: 10 mg via INTRAVENOUS

## 2024-06-09 MED ORDER — PROPOFOL 10 MG/ML IV BOLUS
INTRAVENOUS | Status: AC
  Filled 2024-06-09: qty 20

## 2024-06-09 MED ORDER — HYDROMORPHONE HCL 1 MG/ML IJ SOLN
INTRAMUSCULAR | Status: DC | PRN
  Administered 2024-06-09 (×2): .25 mg via INTRAVENOUS

## 2024-06-09 MED ORDER — MIDAZOLAM HCL 2 MG/2ML IJ SOLN
INTRAMUSCULAR | Status: AC
  Filled 2024-06-09: qty 2

## 2024-06-09 MED ORDER — SUGAMMADEX SODIUM 200 MG/2ML IV SOLN
INTRAVENOUS | Status: AC
  Filled 2024-06-09: qty 2

## 2024-06-09 MED ORDER — FENTANYL CITRATE (PF) 100 MCG/2ML IJ SOLN
25.0000 ug | INTRAMUSCULAR | Status: DC | PRN
  Administered 2024-06-09: 50 ug via INTRAVENOUS

## 2024-06-09 MED ORDER — TRANEXAMIC ACID-NACL 1000-0.7 MG/100ML-% IV SOLN
INTRAVENOUS | Status: AC
  Filled 2024-06-09: qty 100

## 2024-06-09 MED ORDER — POLYETHYLENE GLYCOL 3350 17 GM/SCOOP PO POWD
17.0000 g | Freq: Every day | ORAL | 0 refills | Status: AC
  Filled 2024-06-09: qty 238, 14d supply, fill #0

## 2024-06-09 MED ORDER — VANCOMYCIN HCL 1000 MG IV SOLR
INTRAVENOUS | Status: DC | PRN
  Administered 2024-06-09: 1000 mg

## 2024-06-09 MED ORDER — FENTANYL CITRATE (PF) 100 MCG/2ML IJ SOLN
INTRAMUSCULAR | Status: AC
  Filled 2024-06-09: qty 2

## 2024-06-09 MED ORDER — SUGAMMADEX SODIUM 200 MG/2ML IV SOLN
INTRAVENOUS | Status: DC | PRN
  Administered 2024-06-09: 150 mg via INTRAVENOUS

## 2024-06-09 MED ORDER — CEFAZOLIN SODIUM-DEXTROSE 2-4 GM/100ML-% IV SOLN
INTRAVENOUS | Status: AC
  Filled 2024-06-09: qty 100

## 2024-06-09 MED ORDER — ONDANSETRON HCL 4 MG/2ML IJ SOLN: 4.0000 mg | Freq: Once | INTRAMUSCULAR | Status: DC | PRN

## 2024-06-09 MED ORDER — BUPIVACAINE-EPINEPHRINE (PF) 0.25% -1:200000 IJ SOLN
INTRAMUSCULAR | Status: DC | PRN
  Administered 2024-06-09: 30 mL

## 2024-06-09 MED ORDER — ACETAMINOPHEN 500 MG PO TABS
1000.0000 mg | ORAL_TABLET | Freq: Three times a day (TID) | ORAL | 0 refills | Status: AC
  Filled 2024-06-09: qty 84, 14d supply, fill #0

## 2024-06-09 MED ORDER — BUPIVACAINE-EPINEPHRINE (PF) 0.25% -1:200000 IJ SOLN
INTRAMUSCULAR | Status: AC
  Filled 2024-06-09: qty 30

## 2024-06-09 MED ORDER — ACETAMINOPHEN 10 MG/ML IV SOLN: 1000.0000 mg | Freq: Once | INTRAVENOUS | Status: DC | PRN

## 2024-06-09 MED ORDER — SENNA 8.6 MG PO TABS
1.0000 | ORAL_TABLET | Freq: Two times a day (BID) | ORAL | 0 refills | Status: AC
  Filled 2024-06-09: qty 28, 14d supply, fill #0

## 2024-06-09 MED ORDER — DEXAMETHASONE SOD PHOSPHATE PF 10 MG/ML IJ SOLN: 10.0000 mg | Freq: Once | INTRAMUSCULAR | Status: DC

## 2024-06-09 MED ORDER — VANCOMYCIN HCL 1000 MG IV SOLR
INTRAVENOUS | Status: AC
  Filled 2024-06-09: qty 20

## 2024-06-09 MED ORDER — OXYCODONE HCL 5 MG PO TABS: 5.0000 mg | ORAL_TABLET | Freq: Once | ORAL | Status: DC | PRN

## 2024-06-09 MED ORDER — CHLORHEXIDINE GLUCONATE 0.12 % MT SOLN: 15.0000 mL | Freq: Once | OROMUCOSAL | Status: AC

## 2024-06-09 MED ORDER — OXYCODONE HCL 5 MG PO TABS
2.5000 mg | ORAL_TABLET | ORAL | 0 refills | Status: AC | PRN
  Filled 2024-06-09: qty 30, 5d supply, fill #0

## 2024-06-09 MED ORDER — ORAL CARE MOUTH RINSE: 15.0000 mL | Freq: Once | OROMUCOSAL | Status: AC

## 2024-06-09 MED ORDER — KETAMINE HCL 50 MG/5ML IJ SOSY
PREFILLED_SYRINGE | INTRAMUSCULAR | Status: DC | PRN
  Administered 2024-06-09: 15 mg via INTRAVENOUS
  Administered 2024-06-09: 10 mg via INTRAVENOUS

## 2024-06-09 MED ORDER — LIDOCAINE 2% (20 MG/ML) 5 ML SYRINGE
INTRAMUSCULAR | Status: AC
  Filled 2024-06-09: qty 5

## 2024-06-09 MED ORDER — ROCURONIUM BROMIDE 10 MG/ML (PF) SYRINGE
PREFILLED_SYRINGE | INTRAVENOUS | Status: AC
  Filled 2024-06-09: qty 10

## 2024-06-09 MED ORDER — FENTANYL CITRATE (PF) 250 MCG/5ML IJ SOLN
INTRAMUSCULAR | Status: DC | PRN
  Administered 2024-06-09: 100 ug via INTRAVENOUS

## 2024-06-09 MED ORDER — TRANEXAMIC ACID-NACL 1000-0.7 MG/100ML-% IV SOLN
1000.0000 mg | INTRAVENOUS | Status: AC
  Administered 2024-06-09: 1000 mg via INTRAVENOUS

## 2024-06-09 MED ORDER — PROPOFOL 10 MG/ML IV BOLUS
INTRAVENOUS | Status: DC | PRN
  Administered 2024-06-09: 150 mg via INTRAVENOUS

## 2024-06-09 MED ORDER — MIDAZOLAM HCL (PF) 2 MG/2ML IJ SOLN
INTRAMUSCULAR | Status: DC | PRN
  Administered 2024-06-09: 2 mg via INTRAVENOUS

## 2024-06-09 MED ORDER — CHLORHEXIDINE GLUCONATE 0.12 % MT SOLN
OROMUCOSAL | Status: AC
  Administered 2024-06-09: 15 mL via OROMUCOSAL
  Filled 2024-06-09: qty 15

## 2024-06-09 MED ORDER — HYDROMORPHONE HCL 1 MG/ML IJ SOLN
INTRAMUSCULAR | Status: AC
  Filled 2024-06-09: qty 0.5

## 2024-06-09 MED ORDER — ROCURONIUM BROMIDE 10 MG/ML (PF) SYRINGE
PREFILLED_SYRINGE | INTRAVENOUS | Status: DC | PRN
  Administered 2024-06-09 (×2): 10 mg via INTRAVENOUS
  Administered 2024-06-09: 60 mg via INTRAVENOUS
  Administered 2024-06-09: 10 mg via INTRAVENOUS

## 2024-06-09 MED ORDER — CEFAZOLIN SODIUM-DEXTROSE 2-4 GM/100ML-% IV SOLN
2.0000 g | INTRAVENOUS | Status: AC
  Administered 2024-06-09: 2 g via INTRAVENOUS

## 2024-06-09 MED ORDER — METHOCARBAMOL 500 MG PO TABS
500.0000 mg | ORAL_TABLET | Freq: Four times a day (QID) | ORAL | 0 refills | Status: AC
  Filled 2024-06-09: qty 40, 10d supply, fill #0

## 2024-06-09 MED ORDER — ONDANSETRON HCL 4 MG/2ML IJ SOLN
INTRAMUSCULAR | Status: DC | PRN
  Administered 2024-06-09: 4 mg via INTRAVENOUS

## 2024-06-09 MED ORDER — POVIDONE-IODINE 10 % EX SWAB
2.0000 | Freq: Once | CUTANEOUS | Status: AC
  Administered 2024-06-09: 2 via TOPICAL

## 2024-06-09 MED ORDER — LIDOCAINE 2% (20 MG/ML) 5 ML SYRINGE
INTRAMUSCULAR | Status: DC | PRN
  Administered 2024-06-09: 100 mg via INTRAVENOUS

## 2024-06-09 MED ORDER — LACTATED RINGERS IV SOLN: INTRAVENOUS | Status: DC

## 2024-06-09 MED ORDER — OXYCODONE HCL 5 MG/5ML PO SOLN: 5.0000 mg | Freq: Once | ORAL | Status: DC | PRN

## 2024-06-09 MED ORDER — EPHEDRINE SULFATE-NACL 50-0.9 MG/10ML-% IV SOSY
PREFILLED_SYRINGE | INTRAVENOUS | Status: DC | PRN
  Administered 2024-06-09: 5 mg via INTRAVENOUS

## 2024-06-09 MED ORDER — ONDANSETRON HCL 4 MG/2ML IJ SOLN
INTRAMUSCULAR | Status: AC
  Filled 2024-06-09: qty 2

## 2024-06-09 SURGICAL SUPPLY — 43 items
BLADE CLIPPER SURG (BLADE) IMPLANT
BUR NEURO DRILL SOFT 3.0X3.8M (BURR) ×1 IMPLANT
CANISTER SUCTION 3000ML PPV (SUCTIONS) ×1 IMPLANT
CORD BIPOLAR FORCEPS 12FT (ELECTRODE) IMPLANT
COVER SURGICAL LIGHT HANDLE (MISCELLANEOUS) ×1 IMPLANT
DERMABOND ADVANCED .7 DNX12 (GAUZE/BANDAGES/DRESSINGS) ×2 IMPLANT
DRAPE C-ARM 42X72 X-RAY (DRAPES) ×1 IMPLANT
DRAPE MICROSCOPE LEICA 54X105 (DRAPES) IMPLANT
DRAPE SURG 17X23 STRL (DRAPES) ×4 IMPLANT
DRAPE UTILITY XL STRL (DRAPES) ×2 IMPLANT
DRESSING MEPILEX FLEX 4X4 (GAUZE/BANDAGES/DRESSINGS) ×1 IMPLANT
DRSG TEGADERM 4X10 (GAUZE/BANDAGES/DRESSINGS) ×2 IMPLANT
DRSG TEGADERM 4X4.75 (GAUZE/BANDAGES/DRESSINGS) IMPLANT
DURAPREP 26ML APPLICATOR (WOUND CARE) ×1 IMPLANT
ELECT COATED BLADE 2.86 ST (ELECTRODE) ×1 IMPLANT
ELECT PENCIL ROCKER SW 15FT (MISCELLANEOUS) ×1 IMPLANT
ELECTRODE REM PT RTRN 9FT ADLT (ELECTROSURGICAL) ×1 IMPLANT
GAUZE 4X4 16PLY ~~LOC~~+RFID DBL (SPONGE) ×1 IMPLANT
GAUZE SPONGE 4X4 12PLY STRL (GAUZE/BANDAGES/DRESSINGS) ×1 IMPLANT
GLOVE INDICATOR 7.5 STRL GRN (GLOVE) ×1 IMPLANT
GLOVE SS BIOGEL STRL SZ 7.5 (GLOVE) ×1 IMPLANT
GOWN STRL REUS W/TWL LRG LVL3 (GOWN DISPOSABLE) ×1 IMPLANT
GOWN STRL SURGICAL XL XLNG (GOWN DISPOSABLE) ×1 IMPLANT
KIT BASIN OR (CUSTOM PROCEDURE TRAY) ×1 IMPLANT
KIT POSITIONER JACKSON TABLE (MISCELLANEOUS) ×1 IMPLANT
KIT TURNOVER KIT B (KITS) ×1 IMPLANT
MARKER SKIN DUAL TIP RULER LAB (MISCELLANEOUS) ×1 IMPLANT
NDL 22X1.5 STRL (OR ONLY) (MISCELLANEOUS) ×1 IMPLANT
NEEDLE 22X1.5 STRL (OR ONLY) (MISCELLANEOUS) ×1 IMPLANT
PACK LAMINECTOMY ORTHO (CUSTOM PROCEDURE TRAY) ×1 IMPLANT
SOL PREP POV-IOD 4OZ 10% (MISCELLANEOUS) ×1 IMPLANT
SOLN 0.9% NACL POUR BTL 1000ML (IV SOLUTION) ×1 IMPLANT
SOLN STERILE WATER BTL 1000 ML (IV SOLUTION) ×1 IMPLANT
SUCTION TUBE FRAZIER 10FR DISP (SUCTIONS) ×1 IMPLANT
SUT BONE WAX W31G (SUTURE) ×1 IMPLANT
SUT MNCRL AB 3-0 PS2 18 (SUTURE) IMPLANT
SUT VIC AB 0 CT1 18XCR BRD8 (SUTURE) ×1 IMPLANT
SUT VIC AB 2-0 CT1 18 (SUTURE) ×1 IMPLANT
SUT VIC AB 2-0 CT2 18 VCP726D (SUTURE) ×1 IMPLANT
SUT VIC AB 3-0 PS2 18XBRD (SUTURE) ×1 IMPLANT
TOWEL GREEN STERILE (TOWEL DISPOSABLE) ×1 IMPLANT
TOWEL GREEN STERILE FF (TOWEL DISPOSABLE) ×1 IMPLANT
TUBING FEATHERFLOW (TUBING) ×1 IMPLANT

## 2024-06-09 NOTE — Discharge Instructions (Signed)
 Orthopedic Surgery Discharge Instructions  Patient name: Kevin [name] Procedure Performed: L5/S1 microdiscectomy Date of Surgery: 06/09/2024 Surgeon: Ozell Ada, MD  Pre-operative Diagnosis: L5/S1 disc herniation Post-operative Diagnosis: same as above  Discharged to: home Discharge Condition: stable   Activity: You should refrain from bending, lifting, or twisting with objects greater than ten pounds until six weeks after surgery. You are encouraged to walk as much as desired. You can perform household activities such as cleaning dishes, doing laundry, vacuuming, etc. as long as the ten-pound restriction is followed. You do not need to wear a brace during the post-operative period.   Incision Care: Your incision has a dressing over it. That dressing should remain in place and dry at all times for a total of one week after surgery. After one week, you can remove the dressing. Underneath the dressing, you will find skin glue. You should leave the skin glue in place. It will fall off with time. Do not pick, rub, or scrub at it. Do not put cream or lotion over the surgical area. After one week and once the dressing is off, it is okay to let soap and water run over your incision. Again, do not pick, scrub, or rub at the skin glue when bathing. Do not submerge (e.g., take a bath, swim, go in a hot tub, etc.) until six weeks after surgery. There may be some bloody drainage from the incision into the dressing after surgery. This is normal. You do not need to replace the dressing. Continue to leave it in place for the one week as instructed above. Should the dressing become saturated with blood or drainage, please call the office for further instructions.   Medications: You have been prescribed oxycodone . This is a narcotic pain medication and should only be taken as prescribed. You should not drink alcohol or operate heavy machinery (including driving) while taking this medication. The oxycodone  can  cause constipation as a side effect. For that reason, you have been prescribed senna and miralax . These are both laxatives. You do not need to take this medication if you develop diarrhea. Should you remain constipated even while taking these medications, please increase the dose of miralax  to twice daily. Tylenol  has been prescribed to be taken every 8 hours, which will give you additional pain relief. Robaxin  is a muscle relaxer that has been prescribed to you for muscle spasm type pain. Take this medication as needed.   You can use over-the-counter NSAIDs (ibuprofen, Aleve , Celebrex, naproxen , meloxicam , etc.) for additional pain relief after this surgery starting 72 hours after surgery. These medications are safe to take with the Tylenol  you have been prescribed. You should not take these medications if you have or have had kidney problems or gastrointestinal ulcers. Take these medications as instructed on the packaging.   In order to set expectations for opioid prescriptions, you will only be prescribed opioids for a total of six weeks after surgery and, at two-weeks after surgery, your opioid prescription will start to tapered (decreased dosage and number of pills). If you have ongoing need for opioid medication six weeks after surgery, you will be referred to pain management. If you are already established with a provider that is giving you opioid medications, you should schedule an appointment with them for six weeks after surgery if you feel you are going to need another prescription. State law only allows for opioid prescriptions one week at a time. If you are running out of opioid medication near the end of  the week, please call the office during business hours before running out so I can send you another prescription.   You may resume any home blood thinners (warfarin, lovenox, apixaban, plavix, xarelto, etc) 72 hours after your surgery. Take these medications as they were previously  prescribed.  Driving: You should not drive while taking narcotic pain medications. You should start getting back to driving slowly and you may want to try driving in a parking lot before doing anything more.   Diet: You are safe to resume your regular diet after surgery.   Reasons to Call the Office After Surgery: You should feel free to call the office with any concerns or questions you have in the post-operative period, but you should definitely notify the office if you develop: -shortness of breath, chest pain, or trouble breathing -excessive bleeding, drainage, redness, or swelling around the surgical site -fevers, chills, or pain that is getting worse with each passing day -persistent nausea or vomiting -new weakness in either leg -new or worsening numbness or tingling in either leg -numbness in the groin, bowel or bladder incontinence -other concerns about your surgery  Follow Up Appointments: You should have an office appointment scheduled for approximately two weeks after surgery. If you do not remember when this appointment is or do not already have it scheduled, please call the office to schedule.   Office Information:  -Ozell Ada, MD -Phone number: [phone] -Address: [address]       [place], [place] [address]

## 2024-06-09 NOTE — Anesthesia Postprocedure Evaluation (Signed)
"   Anesthesia Post Note  Patient: Kevin [name]  Procedure(s) Performed: LUMBAR LAMINECTOMY/DECOMPRESSION MICRODISCECTOMY LUMBAR FIVE-SACRAL ONE (Spine Lumbar)     Patient location during evaluation: PACU Anesthesia Type: General Level of consciousness: awake and alert Pain management: pain level controlled Vital Signs Assessment: post-procedure vital signs reviewed and stable Respiratory status: spontaneous breathing, nonlabored ventilation, respiratory function stable and patient connected to nasal cannula oxygen Cardiovascular status: blood pressure returned to baseline and stable Postop Assessment: no apparent nausea or vomiting Anesthetic complications: no   No notable events documented.  Last Vitals:  Vitals:   06/09/24 1130 06/09/24 1200  BP: 124/82 126/84  Pulse: 63 74  Resp:    Temp:  36.6 C  SpO2: 98% 98%    Last Pain:  Vitals:   06/09/24 1100  TempSrc:   PainSc: Asleep                 Kevin [name]      "

## 2024-06-09 NOTE — Progress Notes (Addendum)
 Orthopedic Surgery Post-operative Progress Note  Assessment: Patient is a 28 y.o. male who is currently recovering after L5/S1 microdiscectomy   Plan: -Operative plans complete -Drain: none -Out of bed as tolerated, no brace -No bending/lifting/twisting greater than 10 pounds -Pain control -Regular diet -No chemoprophylaxis for dvt or antiplatelets for 72 hours after surgery -Antibiotics x2 post-operative doses -Anticipate discharge to home from PACU  ___________________________________________________________________________   Subjective: No acute events since surgery. Recovering in PACU. Pain well controlled.   Objective:  General: no acute distress, appropriate affect Neurologic: sleeping but awakes to voice, answering questions appropriately, following commands Respiratory: unlabored breathing on room air Skin: dressing clear/dry/intact  MSK (spine):  -Strength exam                                                   Left                  Right   EHL                              4/5                  4/5 TA                                 4/5                  5/5 GSC                             5/5                  5/5 Knee extension            4/5                  4/5 Hip flexion                    4/5                  4/5  -Sensory exam    Sensation intact to light touch in L3-S1 nerve distributions of bilateral lower extremities    Patient name: Kevin [name] Patient MRN: [id number] Date: 06/09/2024

## 2024-06-09 NOTE — Op Note (Signed)
 Orthopedic Spine Surgery Operative Report  Procedure: L5/S1 microdiscectomy  Modifier: none  Date of procedure: 06/09/2024  Patient name: Kevin [name] MRN: [id number] DOB: [birth year]  Surgeon: Ozell Ada, MD Assistant: none Pre-operative diagnosis: L5/S1 disc herniation Post-operative diagnosis: same as above Findings: L5/S1 herniated disc with more herniation centrally than in the lateral recess  Specimens: none Anesthesia: general EBL: 25cc Complications: none Pre-incision antibiotic: ancef  TXA was given prior to incision as well  Implants: none   Indication for procedure: Patient is a 28 y.o. male who presented to the office with signs and symptoms consistent with L5/S1 disc herniation. The patient had tried conservative treatments that did not provide any lasting relief. As result, operative management was discussed. The pre-operative MRI showed a L5/S1 disc herniation so L5/S1 microdiscectomy was presented as a treatment option. The risks, benefits, and alternatives of surgery were covered with the patient. All the patient's questions were answered to his satisfaction. After this discussion, the patient expressed understanding and elected to proceed with surgical intervention.  Procedure Description: The patient was met in the pre-operative holding area. The patient's identity and consent were verified. The operative site was marked. The patient's remaining questions about the surgery were answered. The patient was brought back to the operating room. General anesthesia was induced and an endotracheal tube was placed by the anesthesia staff. The patient was transferred to the prone [place] table in the prone position. All bony prominences were well padded. The head of the bed was slightly elevated and the eyes were free from compression by the face pillow. The surgical area was cleansed with alcohol. Fluoroscopy was then brought in to check rotation on the AP image and to mark  the levels on the lateral image. The patient's skin was then prepped and draped in a standard, sterile fashion. A time out was performed that identified the patient, the procedure, and the operative level. All team members agreed with what was stated in the time out.   A midline incision over the spinous processes of the previously marked levels was made and sharp dissection was continued down through the skin and dermis. Electrocautery was then used to continue the midline dissection down to the level of the spinous process. Subperiosteal dissection was performed using electrocautery to expose the lamina out lateral to the facet joint capsule on the left side. Care was taken to not violate the facet joint capsule. A lateral fluoroscopic image was taken to confirm the level. Subperiosteal dissection with electrocautery was then done to expose all the remainder of the lamina and pars interarticularis of the operative levels of L5 and the cranial aspect of S1. The operative microscope was brought in at this time.   A high-speed matchstick burr was used to thin the hemilamina to the level of the ligamentum flavum. The lamina was thinned with the burr to the edge of the ligamentum flavum insertion. Care was taken to leave at least 8mm of pars interarticularis. A curved curette was used to develop a plane between the ligamentum and the lamina. A series of Kerrison rongeurs were used to remove the thinned lamina to complete the laminotomy. A curved curette was used to elevate the ligamentum flavum off of the thecal sac. A combination of Kerrison rongeurs and a pituitary were used to remove the ligamentum flavum overlying the thecal sac and nerve root in the area of the laminotomy.   A penfield was used to mobilize the S1 nerve root. A nerve root retractor was placed into  the laminotomy site and around the traversing nerve root to mobilize it medially. The disc herniation was visualized. A long-handle knife was used  to create an annulotomy. A pituitary was used to remove the herniated disc fragment. An upbiting pituitary was used to remove more disc that was closer to midline. A nerve hook was placed into the annulotomy to free further loose fragments. A pituitary was used to remove these fragments. Multiple loose fragments were removed. The nerve hook was again placed into the annulotomy and no further no other loose fragments. A woodsen was used to palpate the disc space and there was no remaining herniation. The disc was in line with the posterior vertebral bodies. Three lateral fluoroscopic images were taken to demonstrate the cranial and caudal aspect of the discectomy and the fact that the instrument was flush with the posterior aspect of the vertebral bodies.  The wound was copiously irrigated with sterile saline. 30cc of marcaine  with epinephrine  was used around the soft tissues. 500mg  of vancomycin  powder was placed into the wound. The fascia was reapproximated with 0 vicryl suture. The subcutaneous fat was reapproximated with 0 vicryl suture. The deep dermal layer was reapproximated with 2-0 vicryl. The skin as closed with a 3-0 running monocryl. All counts were correct at the end of the case. Dermabond was applied over the skin. An island dressing was placed over the wound. The patient was transferred back to a bed and brought to the post-anesthesia care unit by anesthesia staff in stable condition.   Post-operative plan: The patient will recover in the post-anesthesia care unit with a plan to go home after recovering from anesthesia. The patient will be out of bed as tolerated with no brace. The patient will be seen in the office approximately 2 weeks from the date of surgery.    Ozell Ada, MD Orthopedic Surgeon

## 2024-06-09 NOTE — Anesthesia Preprocedure Evaluation (Signed)
"                                    Anesthesia Evaluation  Patient identified by MRN, date of birth, ID band Patient awake    Reviewed: Allergy & Precautions, NPO status , Patient's Chart, lab work & pertinent test results, reviewed documented beta blocker date and time   History of Anesthesia Complications Negative for: history of anesthetic complications  Airway Mallampati: II  TM Distance: >3 FB     Dental no notable dental hx.    Pulmonary neg shortness of breath, neg COPD   breath sounds clear to auscultation       Cardiovascular (-) angina (-) CAD, (-) Past MI, (-) Cardiac Stents and (-) CHF (-) pacemaker(-) Cardiac Defibrillator  Rhythm:Regular Rate:Normal  IMPRESSIONS     1. Left ventricular ejection fraction, by estimation, is 55 to 60%. The  left ventricle has normal function. The left ventricle has no regional  wall motion abnormalities. Left ventricular diastolic parameters were  normal.   2. Right ventricular systolic function is normal. The right ventricular  size is normal. Tricuspid regurgitation signal is inadequate for assessing  PA pressure.   3. The mitral valve is grossly normal. Trivial mitral valve  regurgitation.   4. The aortic valve is tricuspid. Aortic valve regurgitation is not  visualized. No aortic stenosis is present. Aortic valve mean gradient  measures 2.0 mmHg.   5. The inferior vena cava is normal in size with greater than 50%  respiratory variability, suggesting right atrial pressure of 3 mmHg.     Neuro/Psych neg Seizures PSYCHIATRIC DISORDERS  Depression Bipolar Disorder      GI/Hepatic ,GERD  ,,(+) neg Cirrhosis        Endo/Other    Renal/GU Renal disease     Musculoskeletal   Abdominal   Peds  Hematology   Anesthesia Other Findings   Reproductive/Obstetrics                              Anesthesia Physical Anesthesia Plan  ASA: 2  Anesthesia Plan: General   Post-op Pain  Management:    Induction: Intravenous  PONV Risk Score and Plan: 2 and Ondansetron  and Dexamethasone   Airway Management Planned: Oral ETT  Additional Equipment:   Intra-op Plan:   Post-operative Plan: Extubation in OR  Informed Consent: I have reviewed the patients History and Physical, chart, labs and discussed the procedure including the risks, benefits and alternatives for the proposed anesthesia with the patient or authorized representative who has indicated his/her understanding and acceptance.     Dental advisory given  Plan Discussed with: CRNA  Anesthesia Plan Comments:          Anesthesia Quick Evaluation  "

## 2024-06-09 NOTE — Transfer of Care (Signed)
 Immediate Anesthesia Transfer of Care Note  Patient: Kevin [name]  Procedure(s) Performed: LUMBAR LAMINECTOMY/DECOMPRESSION MICRODISCECTOMY LUMBAR FIVE-SACRAL ONE (Spine Lumbar)  Patient Location: PACU  Anesthesia Type:General  Level of Consciousness: drowsy  Airway & Oxygen Therapy: Patient Spontanous Breathing and Patient connected to face mask oxygen  Post-op Assessment: Report given to RN and Post -op Vital signs reviewed and stable  Post vital signs: Reviewed and stable  Last Vitals:  Vitals Value Taken Time  BP    Temp    Pulse    Resp    SpO2      Last Pain:  Vitals:   06/09/24 0605  TempSrc:   PainSc: 6       Patients Stated Pain Goal: 3 (06/09/24 9394)  Complications: No notable events documented.

## 2024-06-09 NOTE — H&P (Addendum)
 Orthopedic Spine Surgery H&P Note  Assessment: Patient is a 28 y.o. male with low back pain that radiates into the left lower extremity. Has a L5/S1 disc herniation   Plan: -Out of bed as tolerated, activity as tolerated, no brace -Covered the risks of surgery one more time with the patient and patient elected to proceed with planned surgery -Written consent verified -Hold anticoagulation in anticipation of surgery -Ancef  and TXA on all to OR -NPO for procedure -Site marked -To OR when ready  I seem to be able to visualize the S1 nerve root as it traverses the disc space on the MRI and the disc dose not appear to be abutting the S1 nerve root. The disc herniation appears to abut the right S1 nerve root. I had tole me that this surgery may not relieve his pain especially any back pain. I covered the risks of surgery again with him: recurrent disc herniation, need for additional procedure, durotomy, nerve root injury, foot drop, paralysis, persistent pain, infection, fracture, instability. After our conversation, patient wanted to proceed. I will plan to do a left sided hemilaminotomy to ge to the disc since his symptoms are left sided and the disc herniation is fairly central.  ___________________________________________________________________________  Chief Complaint: low back pain that radiates into the left lower extremity  History: Patient is 28 y.o. male who has been previously seen in the office for low back pain that radiates into the left lower extremity. His symptoms failed to improve with conservative treatment so operative management was discussed at the last office visit. The patient presents today with no changes in his symptoms since the last office visit. See previous office note for further details.    Review of systems: General: denies fevers and chills, myalgias Neurologic: denies recent changes in vision, slurred speech Abdomen: denies nausea, vomiting,  hematemesis Respiratory: denies cough, shortness of breath  Past medical history: Bipolar disorder Migraines Crohn's colitis   Allergies: aripiprazole     Past surgical history:  Appendectomy   Social history: Denies use of nicotine product (smoking, vaping, patches, smokeless) Alcohol use: denies Denies recreational drug use  Family history: -reviewed and not pertinent to lumbar disc herniation   Physical Exam:  General: no acute distress, appears stated age Neurologic: alert, answering questions appropriately, following commands Cardiovascular: regular rate, no cyanosis Respiratory: unlabored breathing on room air, symmetric chest rise Psychiatric: appropriate affect, normal cadence to speech   MSK (spine):  -Strength exam                                                   Left                  Right   EHL                              4/5                  4/5 TA                                 4/5                  5/5 GSC  5/5                  5/5 Knee extension            4/5                  4/5 Hip flexion                    4/5                  4/5  -Sensory exam    Sensation intact to light touch in L3-S1 nerve distributions of bilateral lower extremities   Patient name: Kevin [name] Patient MRN: [id number] Date: 06/09/2024

## 2024-06-09 NOTE — Anesthesia Procedure Notes (Signed)
 Procedure Name: Intubation Date/Time: 06/09/2024 7:49 AM  Performed by: Vera Rochele PARAS, CRNAPre-anesthesia Checklist: Patient identified, Emergency Drugs available, Suction available and Patient being monitored Patient Re-evaluated:Patient Re-evaluated prior to induction Oxygen Delivery Method: Circle System Utilized Preoxygenation: Pre-oxygenation with 100% oxygen Induction Type: IV induction Ventilation: Mask ventilation without difficulty Laryngoscope Size: Miller and 3 Grade View: Grade I Tube type: Oral Tube size: 7.5 mm Number of attempts: 1 Airway Equipment and Method: Stylet and Oral airway Placement Confirmation: ETT inserted through vocal cords under direct vision, positive ETCO2 and breath sounds checked- equal and bilateral Secured at: 23 cm Tube secured with: Tape Dental Injury: Teeth and Oropharynx as per pre-operative assessment

## 2024-06-10 ENCOUNTER — Encounter (HOSPITAL_COMMUNITY): Payer: Self-pay | Admitting: Orthopedic Surgery

## 2024-06-15 ENCOUNTER — Telehealth: Payer: Self-pay | Admitting: Orthopedic Surgery

## 2024-06-15 NOTE — Telephone Encounter (Signed)
 Pt called stating he had back surgery and [place] fixed the right side pains but past conversations left side nerve pains with shocking, stabbing pains only when standing. Pt states it's more intense then before. Pt is asking for call back from [place]. Pt states the surgery helped for right side but left side is becoming unbearable. Pt number is [phone].

## 2024-06-23 ENCOUNTER — Encounter: Admitting: Orthopedic Surgery

## 2024-06-23 DIAGNOSIS — Z9889 Other specified postprocedural states: Secondary | ICD-10-CM

## 2024-06-23 NOTE — Progress Notes (Addendum)
 Orthopedic Surgery Post-operative Office Visit  Procedure: L5/S1 microdiscectomy Date of Surgery: 06/09/2024 (~2 weeks post-op)  Assessment: Patient is a 29 y.o. who still has some pain along the lateral left hip and posterior left knee. Improved from pre-op   Plan: -Operative plans complete -Out of bed as tolerated, no brace -No bending/lifting/twisting greater than 10 pounds -Okay to let soap/water run over the incision but do not submerge -Pain management: tylenol  as needed -Return to office in 4 weeks, lumbar x-rays needed at next visit: none  ___________________________________________________________________________   Subjective: Patient has noticed improvement in his pain since surgery. He is now able to stand longer and cook for himself. His right sided back pain has resolved. He now has left lateral thigh at the proximal aspect of the thigh near greater trochanter pain and left posterior knee pain.  He notes the knee pain when he steps backward.  He does not have any pain going past the knee.  No right leg pain.  He is not taking any medications for pain. Has not noticed any redness or drainage around the incision but has left the dressing on until now.  Objective:  General: no acute distress, appropriate affect Neurologic: alert, answering questions appropriately, following commands Respiratory: unlabored breathing on room air Skin: incision is well-approximated with no erythema, induration, active/depressible drainage  MSK (spine):  -Strength exam      Left  Right  EHL    4/5  5/5 TA    4/5  5/5 GSC    5/5  5/5 Knee extension  5/5  5/5 Hip flexion   5/5  5/5  -Sensory exam    Sensation intact to light touch in L3-S1 nerve distributions of bilateral lower extremities  Imaging: None obtained at today's visit   Patient name: Kevin [name] Patient MRN: [id number] Date of visit: 06/23/2024

## 2024-07-06 ENCOUNTER — Encounter: Payer: Self-pay | Admitting: Orthopedic Surgery

## 2024-07-08 ENCOUNTER — Other Ambulatory Visit: Payer: Self-pay

## 2024-07-10 ENCOUNTER — Other Ambulatory Visit: Payer: Self-pay

## 2024-07-17 ENCOUNTER — Other Ambulatory Visit: Payer: Self-pay

## 2024-07-27 ENCOUNTER — Encounter: Admitting: Orthopedic Surgery
